# Patient Record
Sex: Female | Born: 1960 | Race: White | Hispanic: No | Marital: Married | Smoking: Current every day smoker
Health system: Southern US, Community
[De-identification: ages and names within clinical notes are randomized; demographics above are authoritative.]

## PROBLEM LIST (undated history)

## (undated) DIAGNOSIS — D649 Anemia, unspecified: Secondary | ICD-10-CM

## (undated) DIAGNOSIS — E039 Hypothyroidism, unspecified: Secondary | ICD-10-CM

## (undated) DIAGNOSIS — F329 Major depressive disorder, single episode, unspecified: Secondary | ICD-10-CM

## (undated) DIAGNOSIS — G894 Chronic pain syndrome: Secondary | ICD-10-CM

## (undated) DIAGNOSIS — F319 Bipolar disorder, unspecified: Secondary | ICD-10-CM

## (undated) DIAGNOSIS — IMO0002 Reserved for concepts with insufficient information to code with codable children: Secondary | ICD-10-CM

## (undated) DIAGNOSIS — M797 Fibromyalgia: Secondary | ICD-10-CM

## (undated) DIAGNOSIS — M1990 Unspecified osteoarthritis, unspecified site: Secondary | ICD-10-CM

## (undated) DIAGNOSIS — K219 Gastro-esophageal reflux disease without esophagitis: Secondary | ICD-10-CM

## (undated) DIAGNOSIS — J45909 Unspecified asthma, uncomplicated: Secondary | ICD-10-CM

## (undated) DIAGNOSIS — Z5189 Encounter for other specified aftercare: Secondary | ICD-10-CM

## (undated) DIAGNOSIS — F32A Depression, unspecified: Secondary | ICD-10-CM

## (undated) DIAGNOSIS — G4730 Sleep apnea, unspecified: Secondary | ICD-10-CM

## (undated) DIAGNOSIS — F419 Anxiety disorder, unspecified: Secondary | ICD-10-CM

## (undated) DIAGNOSIS — K759 Inflammatory liver disease, unspecified: Secondary | ICD-10-CM

## (undated) HISTORY — PX: TONSILLECTOMY: SUR1361

## (undated) HISTORY — PX: JOINT REPLACEMENT: SHX530

## (undated) HISTORY — PX: ABDOMINAL HYSTERECTOMY: SHX81

## (undated) HISTORY — PX: HIP SURGERY: SHX245

## (undated) HISTORY — PX: GASTRIC BYPASS: SHX52

## (undated) HISTORY — PX: BACK SURGERY: SHX140

## (undated) HISTORY — PX: DIAGNOSTIC LAPAROSCOPY: SUR761

## (undated) HISTORY — PX: RECTOCELE REPAIR: SHX761

## (undated) HISTORY — PX: RHINOPLASTY: SUR1284

## (undated) HISTORY — PX: KNEE ARTHROSCOPY: SUR90

## (undated) HISTORY — PX: DILATION AND CURETTAGE OF UTERUS: SHX78

## (undated) HISTORY — PX: BREAST SURGERY: SHX581

---

## 2013-10-27 HISTORY — PX: CARDIAC CATHETERIZATION: SHX172

## 2013-12-12 ENCOUNTER — Inpatient Hospital Stay (HOSPITAL_COMMUNITY): Admission: RE | Admit: 2013-12-12 | Payer: Self-pay | Source: Ambulatory Visit

## 2013-12-19 ENCOUNTER — Encounter (HOSPITAL_COMMUNITY): Admission: RE | Payer: Self-pay | Source: Ambulatory Visit

## 2013-12-19 ENCOUNTER — Inpatient Hospital Stay (HOSPITAL_COMMUNITY): Admission: RE | Admit: 2013-12-19 | Payer: Medicare Other | Source: Ambulatory Visit | Admitting: Orthopedic Surgery

## 2013-12-19 SURGERY — TOTAL HIP REVISION
Anesthesia: Spinal | Site: Hip | Laterality: Left

## 2014-04-03 ENCOUNTER — Encounter (HOSPITAL_COMMUNITY): Payer: Self-pay | Admitting: Pharmacy Technician

## 2014-04-04 ENCOUNTER — Other Ambulatory Visit (HOSPITAL_COMMUNITY): Payer: Self-pay | Admitting: Orthopedic Surgery

## 2014-04-04 NOTE — Patient Instructions (Addendum)
21 Tina Weeks  04/04/2014   Your procedure is scheduled on: Monday June 15th, 2015  Report to [facility] Main Entrance and follow signs to  Short Stay Center at 730 AM.  Call this number if you have problems the morning of surgery [phone]   Remember:BRING CPAP MASK AND TUBING  Do not eat food or drink liquids :After Midnight.  Sunday NIGHT     Take these medicines the morning of surgery with A SIP OF WATER:  LEVOTHYROXINE, Soboxone, NEXIUM, CLONAZEPAM, NUCYNTA, LYRICA,  GABAPENTIN, HYDROXINE                               You may not have any metal on your body including hair pins and piercings  Do not wear jewelry, make-up, lotions, powders, or deodorant.   Men may shave face and neck.  Do not bring valuables to the hospital. [facility] IS NOT RESPONSIBLE FOR VALUABLES.  Contacts, dentures or bridgework may not be worn into surgery.  Leave suitcase in the car. After surgery it may be brought to your room.  For patients admitted to the hospital, checkout time is 11:00 AM the day of discharge.   Patients discharged the day of surgery will not be allowed to drive home.  Name and phone number of your driver:husband  Special Instructions: N/A ________________________________________________________________________  [facility] - Preparing for Surgery Before surgery, you can play an important role.  Because skin is not sterile, your skin needs to be as free of germs as possible.  You can reduce the number of germs on your skin by washing with CHG (chlorahexidine gluconate) soap before surgery.  CHG is an antiseptic cleaner which kills germs and bonds with the skin to continue killing germs even after washing. Please DO NOT use if you have an allergy to CHG or antibacterial soaps.  If your skin becomes reddened/irritated stop using the CHG and inform your nurse when you arrive at Short Stay. Do not shave (including legs and underarms) for at least 48 hours prior to the first  CHG shower.  You may shave your face/neck. Please follow these instructions carefully:  1.  Shower with CHG Soap the night before surgery and the  morning of Surgery.  2.  If you choose to wash your hair, wash your hair first as usual with your  normal  shampoo.  3.  After you shampoo, rinse your hair and body thoroughly to remove the  shampoo.                           4.  Use CHG as you would any other liquid soap.  You can apply chg directly  to the skin and wash                       Gently with a scrungie or clean washcloth.  5.  Apply the CHG Soap to your body ONLY FROM THE NECK DOWN.   Do not use on face/ open                           Wound or open sores. Avoid contact with eyes, ears mouth and genitals (private parts).                       Wash face,  Genitals (private parts) with your normal soap.             6.  Wash thoroughly, paying special attention to the area where your surgery  will be performed.  7.  Thoroughly rinse your body with warm water from the neck down.  8.  DO NOT shower/wash with your normal soap after using and rinsing off  the CHG Soap.                9.  Pat yourself dry with a clean towel.            10.  Wear clean pajamas.            11.  Place clean sheets on your bed the night of your first shower and do not  sleep with pets. Day of Surgery : Do not apply any lotions/deodorants the morning of surgery.  Please wear clean clothes to the hospital/surgery center.  FAILURE TO FOLLOW THESE INSTRUCTIONS MAY RESULT IN THE CANCELLATION OF YOUR SURGERY PATIENT SIGNATURE_________________________________  NURSE SIGNATURE__________________________________  ________________________________________________________________________   Rogelia MireIncentive Spirometer  An incentive spirometer is a tool that can help keep your lungs clear and active. This tool measures how well you are filling your lungs with each breath. Taking long deep breaths may help reverse or decrease the  chance of developing breathing (pulmonary) problems (especially infection) following:  A long period of time when you are unable to move or be active. BEFORE THE PROCEDURE   If the spirometer includes an indicator to show your best effort, your nurse or respiratory therapist will set it to a desired goal.  If possible, sit up straight or lean slightly forward. Try not to slouch.  Hold the incentive spirometer in an upright position. INSTRUCTIONS FOR USE  1. Sit on the edge of your bed if possible, or sit up as far as you can in bed or on a chair. 2. Hold the incentive spirometer in an upright position. 3. Breathe out normally. 4. Place the mouthpiece in your mouth and seal your lips tightly around it. 5. Breathe in slowly and as deeply as possible, raising the piston or the ball toward the top of the column. 6. Hold your breath for 3-5 seconds or for as long as possible. Allow the piston or ball to fall to the bottom of the column. 7. Remove the mouthpiece from your mouth and breathe out normally. 8. Rest for a few seconds and repeat Steps 1 through 7 at least 10 times every 1-2 hours when you are awake. Take your time and take a few normal breaths between deep breaths. 9. The spirometer may include an indicator to show your best effort. Use the indicator as a goal to work toward during each repetition. 10. After each set of 10 deep breaths, practice coughing to be sure your lungs are clear. If you have an incision (the cut made at the time of surgery), support your incision when coughing by placing a pillow or rolled up towels firmly against it. Once you are able to get out of bed, walk around indoors and cough well. You may stop using the incentive spirometer when instructed by your caregiver.  RISKS AND COMPLICATIONS  Take your time so you do not get dizzy or light-headed.  If you are in pain, you may need to take or ask for pain medication before doing incentive spirometry. It is harder  to take a deep breath if you are having pain. AFTER USE  Rest and breathe slowly and easily.  It can be helpful to keep track of a log of your progress. Your caregiver can provide you with a simple table to help with this. If you are using the spirometer at home, follow these instructions: SEEK MEDICAL CARE IF:   You are having difficultly using the spirometer.  You have trouble using the spirometer as often as instructed.  Your pain medication is not giving enough relief while using the spirometer.  You develop fever of 100.5 F (38.1 C) or higher. SEEK IMMEDIATE MEDICAL CARE IF:   You cough up bloody sputum that had not been present before.  You develop fever of 102 F (38.9 C) or greater.  You develop worsening pain at or near the incision site. MAKE SURE YOU:   Understand these instructions.  Will watch your condition.  Will get help right away if you are not doing well or get worse. Document Released: 02/23/2007 Document Revised: 01/05/2012 Document Reviewed: 04/26/2007 Exit[employer] Patient Information 2014 Exit[employer], [place].   ________________________________________________________________________  WHAT IS A BLOOD TRANSFUSION? Blood Transfusion Information  A transfusion is the replacement of blood or some of its parts. Blood is made up of multiple cells which provide different functions.  Red blood cells carry oxygen and are used for blood loss replacement.  White blood cells fight against infection.  Platelets control bleeding.  Plasma helps clot blood.  Other blood products are available for specialized needs, such as hemophilia or other clotting disorders. BEFORE THE TRANSFUSION  Who gives blood for transfusions?   Healthy volunteers who are fully evaluated to make sure their blood is safe. This is blood bank blood. Transfusion therapy is the safest it has ever been in the practice of medicine. Before blood is taken from a donor, a complete history is taken  to make sure that person has no history of diseases nor engages in risky social behavior (examples are intravenous drug use or sexual activity with multiple partners). The donor's travel history is screened to minimize risk of transmitting infections, such as malaria. The donated blood is tested for signs of infectious diseases, such as HIV and hepatitis. The blood is then tested to be sure it is compatible with you in order to minimize the chance of a transfusion reaction. If you or a relative donates blood, this is often done in anticipation of surgery and is not appropriate for emergency situations. It takes many days to process the donated blood. RISKS AND COMPLICATIONS Although transfusion therapy is very safe and saves many lives, the main dangers of transfusion include:   Getting an infectious disease.  Developing a transfusion reaction. This is an allergic reaction to something in the blood you were given. Every precaution is taken to prevent this. The decision to have a blood transfusion has been considered carefully by your caregiver before blood is given. Blood is not given unless the benefits outweigh the risks. AFTER THE TRANSFUSION  Right after receiving a blood transfusion, you will usually feel much better and more energetic. This is especially true if your red blood cells have gotten low (anemic). The transfusion raises the level of the red blood cells which carry oxygen, and this usually causes an energy increase.  The nurse administering the transfusion will monitor you carefully for complications. HOME CARE INSTRUCTIONS  No special instructions are needed after a transfusion. You may find your energy is better. Speak with your caregiver about any limitations on activity for underlying diseases you may have. SEEK  MEDICAL CARE IF:   Your condition is not improving after your transfusion.  You develop redness or irritation at the intravenous (IV) site. SEEK IMMEDIATE MEDICAL CARE  IF:  Any of the following symptoms occur over the next 12 hours:  Shaking chills.  You have a temperature by mouth above 102 F (38.9 C), not controlled by medicine.  Chest, back, or muscle pain.  People around you feel you are not acting correctly or are confused.  Shortness of breath or difficulty breathing.  Dizziness and fainting.  You get a rash or develop hives.  You have a decrease in urine output.  Your urine turns a dark color or changes to pink, red, or brown. Any of the following symptoms occur over the next 10 days:  You have a temperature by mouth above 102 F (38.9 C), not controlled by medicine.  Shortness of breath.  Weakness after normal activity.  The white part of the eye turns yellow (jaundice).  You have a decrease in the amount of urine or are urinating less often.  Your urine turns a dark color or changes to pink, red, or brown. Document Released: 10/10/2000 Document Revised: 01/05/2012 Document Reviewed: 05/29/2008 [facility] Patient Information 2014 [place], [place].  _______________________________________________________________________

## 2014-04-04 NOTE — Progress Notes (Signed)
MEDICAL CLEARANCE NOTE Tina Weeks FNO ON CHART CARDIAC CLEARANCE/LOV NOTE DR Eliezer Bottom  02-11-14 ON CHART PULMONARY CLEARANCE/LOV NOTE DTR [facility] 03-10-14 ON CHART SLEEP STUDY 01-09-14 [facility] ON CHART EKG 01-17-14 [facility] ON CHART CHEST XRAY 01-18-14 [facility] ON CHART

## 2014-04-05 ENCOUNTER — Encounter (INDEPENDENT_AMBULATORY_CARE_PROVIDER_SITE_OTHER): Payer: Self-pay

## 2014-04-05 ENCOUNTER — Encounter (HOSPITAL_COMMUNITY): Payer: Self-pay

## 2014-04-05 ENCOUNTER — Encounter (HOSPITAL_COMMUNITY)
Admission: RE | Admit: 2014-04-05 | Discharge: 2014-04-05 | Disposition: A | Discharge status: Home / Self Care | Payer: Medicare Other | Source: Ambulatory Visit | Attending: Orthopedic Surgery | Admitting: Orthopedic Surgery

## 2014-04-05 DIAGNOSIS — Z01818 Encounter for other preprocedural examination: Secondary | ICD-10-CM | POA: Insufficient documentation

## 2014-04-05 DIAGNOSIS — Z01812 Encounter for preprocedural laboratory examination: Secondary | ICD-10-CM | POA: Insufficient documentation

## 2014-04-05 HISTORY — DX: Depression, unspecified: F32.A

## 2014-04-05 HISTORY — DX: Reserved for concepts with insufficient information to code with codable children: IMO0002

## 2014-04-05 HISTORY — DX: Major depressive disorder, single episode, unspecified: F32.9

## 2014-04-05 HISTORY — DX: Fibromyalgia: M79.7

## 2014-04-05 HISTORY — DX: Unspecified asthma, uncomplicated: J45.909

## 2014-04-05 HISTORY — DX: Bipolar disorder, unspecified: F31.9

## 2014-04-05 HISTORY — DX: Encounter for other specified aftercare: Z51.89

## 2014-04-05 HISTORY — DX: Anxiety disorder, unspecified: F41.9

## 2014-04-05 HISTORY — DX: Inflammatory liver disease, unspecified: K75.9

## 2014-04-05 HISTORY — DX: Anemia, unspecified: D64.9

## 2014-04-05 HISTORY — DX: Gastro-esophageal reflux disease without esophagitis: K21.9

## 2014-04-05 HISTORY — DX: Unspecified osteoarthritis, unspecified site: M19.90

## 2014-04-05 HISTORY — DX: Hypothyroidism, unspecified: E03.9

## 2014-04-05 HISTORY — DX: Sleep apnea, unspecified: G47.30

## 2014-04-05 HISTORY — DX: Chronic pain syndrome: G89.4

## 2014-04-05 LAB — URINALYSIS, ROUTINE W REFLEX MICROSCOPIC
Bilirubin Urine: NEGATIVE
Glucose, UA: NEGATIVE mg/dL
Hgb urine dipstick: NEGATIVE
Ketones, ur: NEGATIVE mg/dL
LEUKOCYTES UA: NEGATIVE
Nitrite: NEGATIVE
PH: 6 (ref 5.0–8.0)
Protein, ur: NEGATIVE mg/dL
Specific Gravity, Urine: 1.008 (ref 1.005–1.030)
Urobilinogen, UA: 0.2 mg/dL (ref 0.0–1.0)

## 2014-04-05 LAB — COMPREHENSIVE METABOLIC PANEL
ALK PHOS: 75 U/L (ref 39–117)
ALT: 30 U/L (ref 0–35)
AST: 34 U/L (ref 0–37)
Albumin: 3.6 g/dL (ref 3.5–5.2)
BUN: 9 mg/dL (ref 6–23)
CO2: 29 mEq/L (ref 19–32)
Calcium: 9.2 mg/dL (ref 8.4–10.5)
Chloride: 101 mEq/L (ref 96–112)
Creatinine, Ser: 0.69 mg/dL (ref 0.50–1.10)
GFR calc Af Amer: >90 mL/min (ref >90)
GFR calc non Af Amer: >90 mL/min (ref >90)
Glucose, Bld: 91 mg/dL (ref 70–99)
Potassium: 4.5 mEq/L (ref 3.7–5.3)
SODIUM: 141 meq/L (ref 137–147)
TOTAL PROTEIN: 7.6 g/dL (ref 6.0–8.3)
Total Bilirubin: 0.3 mg/dL (ref 0.3–1.2)

## 2014-04-05 LAB — ABO/RH: ABO/RH(D): O POS

## 2014-04-05 LAB — CBC
HCT: 40.8 % (ref 36.0–46.0)
Hemoglobin: 13.3 g/dL (ref 12.0–15.0)
MCH: 27 pg (ref 26.0–34.0)
MCHC: 32.6 g/dL (ref 30.0–36.0)
MCV: 82.8 fL (ref 78.0–100.0)
Platelets: 176 10*3/uL (ref 150–400)
RBC: 4.93 MIL/uL (ref 3.87–5.11)
RDW: 17.2 % — AB (ref 11.5–15.5)
WBC: 8.3 10*3/uL (ref 4.0–10.5)

## 2014-04-05 LAB — PROTIME-INR
INR: 1.07 (ref 0.00–1.49)
Prothrombin Time: 13.7 seconds (ref 11.6–15.2)

## 2014-04-05 LAB — APTT: APTT: 31 s (ref 24–37)

## 2014-04-05 LAB — SURGICAL PCR SCREEN
MRSA, PCR: NEGATIVE
Staphylococcus aureus: NEGATIVE

## 2014-04-05 NOTE — Progress Notes (Signed)
Spoke with Dr Renold Don regarding pulmonary recommendations pre op from Dr Blenda Nicely.  Stated will be evaluated morning of surgery depending on type of anesthesia will be utilized.   Record placed on chart.  Instructed patient of  time change.  She is to arrive SHORT STAY at 12:15 pm.  May have clear liquids until 08:15 am then nothing by mouth.  Verbalizes understanding, and clear liquids reviewed

## 2014-04-09 NOTE — H&P (Signed)
TOTAL HIP REVISION ADMISSION H&P  Patient is admitted for left revision total hip arthroplasty.  Subjective:  Chief Complaint: Failed left hip arthroplasty  HPI: Clemencia Courselaudia Riebel, [age] y.o. 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  The indications for the revision total hip arthroplasty are pain and malrotation.  Onset of symptoms was gradual starting years ago with gradually worsening course since that time.  Prior procedures on the left hip include arthroplasty and a revision.  Patient currently rates pain in the left hip at 9 out of 10 with activity.  There is night pain, worsening of pain with activity and weight bearing, trendelenberg gait, pain that interfers with activities of daily living and pain with passive range of motion. 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  This condition presents safety issues increasing the risk of falls.  There is no current active infection.  Risks, benefits and expectations were discussed with the patient.  Risks including but not limited to the risk of anesthesia, blood clots, nerve damage, blood vessel damage, failure of the prosthesis, infection and up to and including death.  Patient understand the risks, benefits and expectations and wishes to proceed with surgery.   PCP: Dema SeverinYORK,REGINA F, NP  D/C Plans:      Home with HHPT  Post-op Meds:       No Rx given   Tranexamic Acid:      Is not to be given - previous MI  Decadron:      Is to be given  FYI:     ASA post-op  CPAP  Pain management - Dr. Loraine LericheMark (inc current)  Nicotine Patch - 14 mg    Past Medical History  Diagnosis Date  . Hypothyroidism   . Sleep apnea   . GERD (gastroesophageal reflux disease)   . Fibromyalgia   . Arthritis   . Encounter for blood transfusion   . Hepatitis     C-   years ago  . Anemia   . DDD (degenerative disc disease)     lumbar, cervical   . Bipolar disorder   . Chronic pain syndrome   . Anxiety   . Depression   . Asthma     intrensic    Past Surgical History  Procedure Laterality Date  . Joint replacement Bilateral   . Hip surgery Left     revision/replacements x 14  . Back surgery      cervical fusion x 3  . Back surgery      fusions x 5  lumbar/sacral  . Tonsillectomy    . Breast surgery Right     I&D x 2  . Abdominal hysterectomy    . Dilation and curettage of uterus      x 3  . Diagnostic laparoscopy      salpingectomy  . Rectocele repair    . Knee arthroscopy Right     x 2  . Rhinoplasty    . Cesarean section    . Cardiac catheterization  2015  . Gastric bypass      No prescriptions prior to admission   Allergies  Allergen Reactions  . Shellfish Allergy Hives    History  Substance Use Topics  . Smoking status: Current Every Day Smoker -- 0.50 packs/day for 25 years  . Smokeless tobacco: Never Used  . Alcohol Use: No    No family history on  file.    Review of Systems  Constitutional: Negative.   HENT: Negative.   Eyes: Negative.   Respiratory: Negative.   Cardiovascular: Negative.   Gastrointestinal: Positive for heartburn.  Genitourinary: Negative.   Musculoskeletal: Positive for back pain, joint pain and myalgias.  Skin: Negative.   Neurological: Negative.   Endo/Heme/Allergies: Negative.   Psychiatric/Behavioral: Positive for depression. The patient is nervous/anxious.     Objective:  Physical Exam  Constitutional: She is oriented to person, place, and time. She appears well-developed and well-nourished.  HENT:  Head: Normocephalic and atraumatic.  Mouth/Throat: Oropharynx is clear and moist.  Eyes: Pupils are equal, round, and reactive to light.  Neck: Neck supple. No JVD present. No tracheal deviation present. No thyromegaly present.  Cardiovascular: Normal rate, regular rhythm, normal heart sounds and intact distal pulses.   Respiratory: Effort normal and breath  sounds normal. No stridor. No respiratory distress. She has no wheezes.  GI: Soft. There is no tenderness. There is no guarding.  Musculoskeletal:       Left hip: She exhibits decreased range of motion, decreased strength, tenderness, bony tenderness and laceration (healed). She exhibits no swelling and no deformity.  Lymphadenopathy:    She has no cervical adenopathy.  Neurological: She is alert and oriented to person, place, and time.  Skin: Skin is warm and dry.  Psychiatric: She has a normal mood and affect.     Imaging Review:  Plain radiographs demonstrate previous total hip arthroplasty of the left hip(s). 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   Assessment/Plan:  End stage arthritis, left hip(s) with failed previous arthroplasty.  The patient history, physical examination, clinical judgement of the provider and imaging studies are consistent with end stage degenerative joint disease of the left hip(s), previous total hip arthroplasty. Revision total hip arthroplasty is deemed medically necessary. The treatment options including medical management, injection therapy, arthroscopy and arthroplasty were discussed at length. The risks and benefits of total hip arthroplasty were presented and reviewed. The risks due to aseptic loosening, infection, stiffness, dislocation/subluxation,  thromboembolic complications and other imponderables were discussed.  The patient acknowledged the explanation, agreed to proceed with the plan and consent was signed. Patient is being admitted for inpatient treatment for surgery, pain control, PT, OT, prophylactic antibiotics, VTE prophylaxis, progressive ambulation and ADL's and discharge planning. The patient is planning to be discharged home with home health services.    Anastasio AuerbachMatthew S. Sahithi Ordoyne   PA-C  04/09/2014, 5:38 PM

## 2014-04-10 ENCOUNTER — Encounter (HOSPITAL_COMMUNITY)
Admission: RE | Disposition: A | Discharge status: Home Health | Payer: Self-pay | Source: Ambulatory Visit | Attending: Orthopedic Surgery

## 2014-04-10 ENCOUNTER — Inpatient Hospital Stay (HOSPITAL_COMMUNITY)
Admission: RE | Admit: 2014-04-10 | Discharge: 2014-04-13 | DRG: 467 | Disposition: A | Discharge status: Home Health | Payer: Medicare Other | Source: Ambulatory Visit | Attending: Orthopedic Surgery | Admitting: Orthopedic Surgery

## 2014-04-10 ENCOUNTER — Inpatient Hospital Stay (HOSPITAL_COMMUNITY): Payer: Medicare Other | Admitting: *Deleted

## 2014-04-10 ENCOUNTER — Inpatient Hospital Stay (HOSPITAL_COMMUNITY): Payer: Medicare Other

## 2014-04-10 ENCOUNTER — Encounter (HOSPITAL_COMMUNITY): Payer: Self-pay | Admitting: *Deleted

## 2014-04-10 ENCOUNTER — Encounter (HOSPITAL_COMMUNITY): Payer: Medicare Other | Admitting: *Deleted

## 2014-04-10 DIAGNOSIS — K219 Gastro-esophageal reflux disease without esophagitis: Secondary | ICD-10-CM | POA: Diagnosis present

## 2014-04-10 DIAGNOSIS — Z96649 Presence of unspecified artificial hip joint: Secondary | ICD-10-CM

## 2014-04-10 DIAGNOSIS — G4730 Sleep apnea, unspecified: Secondary | ICD-10-CM | POA: Diagnosis present

## 2014-04-10 DIAGNOSIS — D62 Acute posthemorrhagic anemia: Secondary | ICD-10-CM | POA: Diagnosis not present

## 2014-04-10 DIAGNOSIS — F329 Major depressive disorder, single episode, unspecified: Secondary | ICD-10-CM | POA: Diagnosis present

## 2014-04-10 DIAGNOSIS — J45909 Unspecified asthma, uncomplicated: Secondary | ICD-10-CM | POA: Diagnosis present

## 2014-04-10 DIAGNOSIS — F411 Generalized anxiety disorder: Secondary | ICD-10-CM | POA: Diagnosis present

## 2014-04-10 DIAGNOSIS — Z8619 Personal history of other infectious and parasitic diseases: Secondary | ICD-10-CM

## 2014-04-10 DIAGNOSIS — E669 Obesity, unspecified: Secondary | ICD-10-CM | POA: Diagnosis present

## 2014-04-10 DIAGNOSIS — IMO0001 Reserved for inherently not codable concepts without codable children: Secondary | ICD-10-CM | POA: Diagnosis present

## 2014-04-10 DIAGNOSIS — D500 Iron deficiency anemia secondary to blood loss (chronic): Secondary | ICD-10-CM | POA: Diagnosis not present

## 2014-04-10 DIAGNOSIS — T84099A Other mechanical complication of unspecified internal joint prosthesis, initial encounter: Principal | ICD-10-CM | POA: Diagnosis present

## 2014-04-10 DIAGNOSIS — Y831 Surgical operation with implant of artificial internal device as the cause of abnormal reaction of the patient, or of later complication, without mention of misadventure at the time of the procedure: Secondary | ICD-10-CM | POA: Diagnosis present

## 2014-04-10 DIAGNOSIS — E039 Hypothyroidism, unspecified: Secondary | ICD-10-CM | POA: Diagnosis present

## 2014-04-10 DIAGNOSIS — Z01812 Encounter for preprocedural laboratory examination: Secondary | ICD-10-CM

## 2014-04-10 DIAGNOSIS — F17200 Nicotine dependence, unspecified, uncomplicated: Secondary | ICD-10-CM | POA: Diagnosis present

## 2014-04-10 DIAGNOSIS — Z6837 Body mass index (BMI) 37.0-37.9, adult: Secondary | ICD-10-CM

## 2014-04-10 DIAGNOSIS — F3289 Other specified depressive episodes: Secondary | ICD-10-CM | POA: Diagnosis present

## 2014-04-10 HISTORY — PX: TOTAL HIP REVISION: SHX763

## 2014-04-10 LAB — TYPE AND SCREEN
ABO/RH(D): O POS
Antibody Screen: NEGATIVE

## 2014-04-10 SURGERY — TOTAL HIP REVISION
Anesthesia: General | Site: Hip | Laterality: Left

## 2014-04-10 MED ORDER — SUFENTANIL CITRATE 50 MCG/ML IV SOLN
INTRAVENOUS | Status: AC
  Filled 2014-04-10: qty 1

## 2014-04-10 MED ORDER — CELECOXIB 200 MG PO CAPS
200.0000 mg | ORAL_CAPSULE | Freq: Two times a day (BID) | ORAL | Status: DC
  Administered 2014-04-10 – 2014-04-13 (×5): 200 mg via ORAL
  Filled 2014-04-10 (×7): qty 1

## 2014-04-10 MED ORDER — CISATRACURIUM BESYLATE (PF) 10 MG/5ML IV SOLN
INTRAVENOUS | Status: DC | PRN
  Administered 2014-04-10: 8 mg via INTRAVENOUS

## 2014-04-10 MED ORDER — MEPERIDINE HCL 50 MG/ML IJ SOLN: 6.2500 mg | INTRAMUSCULAR | Status: DC | PRN

## 2014-04-10 MED ORDER — SUFENTANIL CITRATE 50 MCG/ML IV SOLN
INTRAVENOUS | Status: DC | PRN
  Administered 2014-04-10: 10 ug via INTRAVENOUS
  Administered 2014-04-10: 20 ug via INTRAVENOUS

## 2014-04-10 MED ORDER — ONDANSETRON HCL 4 MG PO TABS: 4.0000 mg | ORAL_TABLET | Freq: Four times a day (QID) | ORAL | Status: DC | PRN

## 2014-04-10 MED ORDER — HYDROMORPHONE HCL PF 1 MG/ML IJ SOLN
INTRAMUSCULAR | Status: DC | PRN
Start: 2014-04-10 — End: 2014-04-10
  Administered 2014-04-10 (×4): 0.5 mg via INTRAVENOUS

## 2014-04-10 MED ORDER — PREGABALIN 100 MG PO CAPS
300.0000 mg | ORAL_CAPSULE | Freq: Two times a day (BID) | ORAL | Status: DC
  Administered 2014-04-10 – 2014-04-13 (×3): 300 mg via ORAL
  Filled 2014-04-10 (×2): qty 3
  Filled 2014-04-10: qty 6

## 2014-04-10 MED ORDER — GLYCOPYRROLATE 0.2 MG/ML IJ SOLN
INTRAMUSCULAR | Status: AC
  Filled 2014-04-10: qty 3

## 2014-04-10 MED ORDER — CLONAZEPAM 1 MG PO TABS
1.0000 mg | ORAL_TABLET | Freq: Four times a day (QID) | ORAL | Status: DC
  Administered 2014-04-10: 1 mg via ORAL
  Filled 2014-04-10 (×2): qty 1

## 2014-04-10 MED ORDER — PROPOFOL 10 MG/ML IV BOLUS
INTRAVENOUS | Status: AC
  Filled 2014-04-10: qty 20

## 2014-04-10 MED ORDER — GLYCOPYRROLATE 0.2 MG/ML IJ SOLN
INTRAMUSCULAR | Status: DC | PRN
  Administered 2014-04-10: 0.6 mg via INTRAVENOUS

## 2014-04-10 MED ORDER — ONDANSETRON HCL 4 MG/2ML IJ SOLN
INTRAMUSCULAR | Status: AC
  Filled 2014-04-10: qty 2

## 2014-04-10 MED ORDER — CISATRACURIUM BESYLATE 20 MG/10ML IV SOLN
INTRAVENOUS | Status: AC
  Filled 2014-04-10: qty 10

## 2014-04-10 MED ORDER — PROMETHAZINE HCL 25 MG/ML IJ SOLN
6.2500 mg | INTRAMUSCULAR | Status: DC | PRN
Start: 2014-04-10 — End: 2014-04-10

## 2014-04-10 MED ORDER — DOCUSATE SODIUM 100 MG PO CAPS
100.0000 mg | ORAL_CAPSULE | Freq: Two times a day (BID) | ORAL | Status: DC
  Administered 2014-04-10 – 2014-04-13 (×5): 100 mg via ORAL

## 2014-04-10 MED ORDER — BISACODYL 10 MG RE SUPP: 10.0000 mg | Freq: Every day | RECTAL | Status: DC | PRN

## 2014-04-10 MED ORDER — METOCLOPRAMIDE HCL 10 MG PO TABS: 5.0000 mg | ORAL_TABLET | Freq: Three times a day (TID) | ORAL | Status: DC | PRN

## 2014-04-10 MED ORDER — PHENOL 1.4 % MT LIQD
1.0000 | OROMUCOSAL | Status: DC | PRN
  Filled 2014-04-10: qty 177

## 2014-04-10 MED ORDER — DEXAMETHASONE SODIUM PHOSPHATE 10 MG/ML IJ SOLN: 10.0000 mg | Freq: Once | INTRAMUSCULAR | Status: DC

## 2014-04-10 MED ORDER — SODIUM CHLORIDE 0.9 % IV SOLN
100.0000 mL/h | INTRAVENOUS | Status: DC
  Administered 2014-04-10 – 2014-04-11 (×3): 100 mL/h via INTRAVENOUS
  Filled 2014-04-10 (×11): qty 1000

## 2014-04-10 MED ORDER — WARFARIN SODIUM 7.5 MG PO TABS
7.5000 mg | ORAL_TABLET | Freq: Once | ORAL | Status: AC
  Administered 2014-04-10: 7.5 mg via ORAL
  Filled 2014-04-10: qty 1

## 2014-04-10 MED ORDER — METOCLOPRAMIDE HCL 5 MG/ML IJ SOLN: 5.0000 mg | Freq: Three times a day (TID) | INTRAMUSCULAR | Status: DC | PRN

## 2014-04-10 MED ORDER — LACTATED RINGERS IV SOLN
INTRAVENOUS | Status: DC
  Administered 2014-04-10: 1000 mL via INTRAVENOUS

## 2014-04-10 MED ORDER — SUCCINYLCHOLINE CHLORIDE 20 MG/ML IJ SOLN
INTRAMUSCULAR | Status: DC | PRN
  Administered 2014-04-10: 100 mg via INTRAVENOUS

## 2014-04-10 MED ORDER — NEOSTIGMINE METHYLSULFATE 10 MG/10ML IV SOLN
INTRAVENOUS | Status: DC | PRN
  Administered 2014-04-10: 4 mg via INTRAVENOUS

## 2014-04-10 MED ORDER — METHOCARBAMOL 500 MG PO TABS
500.0000 mg | ORAL_TABLET | Freq: Four times a day (QID) | ORAL | Status: DC | PRN
  Administered 2014-04-12 – 2014-04-13 (×2): 500 mg via ORAL
  Filled 2014-04-10 (×2): qty 1

## 2014-04-10 MED ORDER — ONDANSETRON HCL 4 MG/2ML IJ SOLN
4.0000 mg | Freq: Four times a day (QID) | INTRAMUSCULAR | Status: DC | PRN
Start: 2014-04-10 — End: 2014-04-13

## 2014-04-10 MED ORDER — ALUM & MAG HYDROXIDE-SIMETH 200-200-20 MG/5ML PO SUSP: 30.0000 mL | ORAL | Status: DC | PRN

## 2014-04-10 MED ORDER — LEVOTHYROXINE SODIUM 137 MCG PO TABS
137.0000 ug | ORAL_TABLET | Freq: Every day | ORAL | Status: DC
  Administered 2014-04-11 – 2014-04-13 (×3): 137 ug via ORAL
  Filled 2014-04-10 (×4): qty 1

## 2014-04-10 MED ORDER — EPHEDRINE SULFATE 50 MG/ML IJ SOLN
INTRAMUSCULAR | Status: DC | PRN
  Administered 2014-04-10 (×2): 10 mg via INTRAVENOUS

## 2014-04-10 MED ORDER — PROPOFOL 10 MG/ML IV BOLUS
INTRAVENOUS | Status: DC | PRN
  Administered 2014-04-10: 200 mg via INTRAVENOUS

## 2014-04-10 MED ORDER — RISPERIDONE 2 MG PO TABS
4.0000 mg | ORAL_TABLET | Freq: Every day | ORAL | Status: DC
  Administered 2014-04-10: 4 mg via ORAL
  Filled 2014-04-10 (×4): qty 2

## 2014-04-10 MED ORDER — BUPRENORPHINE HCL 8 MG SL SUBL
8.0000 mg | SUBLINGUAL_TABLET | Freq: Three times a day (TID) | SUBLINGUAL | Status: DC
  Administered 2014-04-10 – 2014-04-13 (×4): 8 mg via SUBLINGUAL
  Filled 2014-04-10 (×4): qty 1

## 2014-04-10 MED ORDER — MAGNESIUM CITRATE PO SOLN: 1.0000 | Freq: Once | ORAL | Status: AC | PRN

## 2014-04-10 MED ORDER — MIDAZOLAM HCL 2 MG/2ML IJ SOLN
INTRAMUSCULAR | Status: AC
  Filled 2014-04-10: qty 2

## 2014-04-10 MED ORDER — HYDROXYZINE HCL 50 MG PO TABS
50.0000 mg | ORAL_TABLET | Freq: Four times a day (QID) | ORAL | Status: DC
  Administered 2014-04-10 – 2014-04-13 (×2): 50 mg via ORAL
  Filled 2014-04-10 (×13): qty 1

## 2014-04-10 MED ORDER — SODIUM CHLORIDE 0.9 % IR SOLN
Status: DC | PRN
  Administered 2014-04-10: 3000 mL

## 2014-04-10 MED ORDER — HYDROMORPHONE HCL PF 1 MG/ML IJ SOLN: 0.5000 mg | INTRAMUSCULAR | Status: DC | PRN

## 2014-04-10 MED ORDER — POLYETHYLENE GLYCOL 3350 17 G PO PACK
17.0000 g | PACK | Freq: Two times a day (BID) | ORAL | Status: DC
  Administered 2014-04-10 – 2014-04-12 (×3): 17 g via ORAL

## 2014-04-10 MED ORDER — HYDROMORPHONE HCL PF 2 MG/ML IJ SOLN
INTRAMUSCULAR | Status: AC
  Filled 2014-04-10: qty 1

## 2014-04-10 MED ORDER — LIDOCAINE HCL (CARDIAC) 20 MG/ML IV SOLN
INTRAVENOUS | Status: DC | PRN
  Administered 2014-04-10: 100 mg via INTRAVENOUS

## 2014-04-10 MED ORDER — OXYCODONE HCL 5 MG PO TABS: 5.0000 mg | ORAL_TABLET | Freq: Once | ORAL | Status: DC | PRN

## 2014-04-10 MED ORDER — PANTOPRAZOLE SODIUM 40 MG PO TBEC
80.0000 mg | DELAYED_RELEASE_TABLET | Freq: Every day | ORAL | Status: DC
  Administered 2014-04-11 – 2014-04-13 (×3): 80 mg via ORAL
  Filled 2014-04-10 (×3): qty 2

## 2014-04-10 MED ORDER — ONDANSETRON HCL 4 MG/2ML IJ SOLN
INTRAMUSCULAR | Status: DC | PRN
  Administered 2014-04-10: 4 mg via INTRAVENOUS

## 2014-04-10 MED ORDER — FENTANYL CITRATE 0.05 MG/ML IJ SOLN
INTRAMUSCULAR | Status: DC | PRN
  Administered 2014-04-10: 50 ug via INTRAVENOUS

## 2014-04-10 MED ORDER — OXYCODONE HCL 5 MG PO TABS
15.0000 mg | ORAL_TABLET | ORAL | Status: DC
  Administered 2014-04-11: 5 mg via ORAL
  Filled 2014-04-10: qty 3

## 2014-04-10 MED ORDER — WARFARIN - PHARMACIST DOSING INPATIENT: Freq: Every day | Status: DC

## 2014-04-10 MED ORDER — FENTANYL CITRATE 0.05 MG/ML IJ SOLN
INTRAMUSCULAR | Status: AC
  Filled 2014-04-10: qty 2

## 2014-04-10 MED ORDER — DEXAMETHASONE SODIUM PHOSPHATE 10 MG/ML IJ SOLN
10.0000 mg | Freq: Once | INTRAMUSCULAR | Status: AC
  Administered 2014-04-11: 10 mg via INTRAVENOUS
  Filled 2014-04-10: qty 1

## 2014-04-10 MED ORDER — CEFAZOLIN SODIUM-DEXTROSE 2-3 GM-% IV SOLR
2.0000 g | Freq: Four times a day (QID) | INTRAVENOUS | Status: AC
  Administered 2014-04-10 – 2014-04-11 (×2): 2 g via INTRAVENOUS
  Filled 2014-04-10 (×2): qty 50

## 2014-04-10 MED ORDER — GABAPENTIN 300 MG PO CAPS
300.0000 mg | ORAL_CAPSULE | Freq: Three times a day (TID) | ORAL | Status: DC
  Administered 2014-04-10 – 2014-04-13 (×5): 300 mg via ORAL
  Filled 2014-04-10 (×10): qty 1

## 2014-04-10 MED ORDER — HYDROMORPHONE HCL PF 1 MG/ML IJ SOLN
INTRAMUSCULAR | Status: AC
  Filled 2014-04-10: qty 2

## 2014-04-10 MED ORDER — 0.9 % SODIUM CHLORIDE (POUR BTL) OPTIME
TOPICAL | Status: DC | PRN
  Administered 2014-04-10: 1000 mL

## 2014-04-10 MED ORDER — ACETAMINOPHEN 10 MG/ML IV SOLN
1000.0000 mg | Freq: Once | INTRAVENOUS | Status: AC
  Administered 2014-04-10: 1000 mg via INTRAVENOUS
  Filled 2014-04-10: qty 100

## 2014-04-10 MED ORDER — OXYCODONE HCL 5 MG/5ML PO SOLN
5.0000 mg | Freq: Once | ORAL | Status: DC | PRN
  Filled 2014-04-10: qty 5

## 2014-04-10 MED ORDER — MIDAZOLAM HCL 5 MG/5ML IJ SOLN
INTRAMUSCULAR | Status: DC | PRN
  Administered 2014-04-10: 2 mg via INTRAVENOUS

## 2014-04-10 MED ORDER — DEXAMETHASONE SODIUM PHOSPHATE 10 MG/ML IJ SOLN
INTRAMUSCULAR | Status: DC | PRN
  Administered 2014-04-10: 10 mg via INTRAVENOUS

## 2014-04-10 MED ORDER — METHOCARBAMOL 1000 MG/10ML IJ SOLN
500.0000 mg | Freq: Four times a day (QID) | INTRAVENOUS | Status: DC | PRN
  Administered 2014-04-10: 500 mg via INTRAVENOUS
  Filled 2014-04-10: qty 5

## 2014-04-10 MED ORDER — DIPHENHYDRAMINE HCL 25 MG PO CAPS: 25.0000 mg | ORAL_CAPSULE | Freq: Four times a day (QID) | ORAL | Status: DC | PRN

## 2014-04-10 MED ORDER — LACTATED RINGERS IV SOLN
INTRAVENOUS | Status: DC | PRN
  Administered 2014-04-10 (×2): via INTRAVENOUS

## 2014-04-10 MED ORDER — CEFAZOLIN SODIUM-DEXTROSE 2-3 GM-% IV SOLR
INTRAVENOUS | Status: AC
  Filled 2014-04-10: qty 50

## 2014-04-10 MED ORDER — TRAZODONE HCL 100 MG PO TABS
100.0000 mg | ORAL_TABLET | Freq: Every day | ORAL | Status: DC
  Administered 2014-04-10: 100 mg via ORAL
  Filled 2014-04-10 (×4): qty 1

## 2014-04-10 MED ORDER — HYDROMORPHONE HCL PF 1 MG/ML IJ SOLN
0.2500 mg | INTRAMUSCULAR | Status: DC | PRN
  Administered 2014-04-10 (×2): 0.5 mg via INTRAVENOUS

## 2014-04-10 MED ORDER — CEFAZOLIN SODIUM-DEXTROSE 2-3 GM-% IV SOLR
2.0000 g | INTRAVENOUS | Status: AC
  Administered 2014-04-10: 2 g via INTRAVENOUS

## 2014-04-10 MED ORDER — MENTHOL 3 MG MT LOZG
1.0000 | LOZENGE | OROMUCOSAL | Status: DC | PRN
  Filled 2014-04-10: qty 9

## 2014-04-10 MED ORDER — FERROUS SULFATE 325 (65 FE) MG PO TABS
325.0000 mg | ORAL_TABLET | Freq: Three times a day (TID) | ORAL | Status: DC
  Administered 2014-04-11 – 2014-04-13 (×7): 325 mg via ORAL
  Filled 2014-04-10 (×10): qty 1

## 2014-04-10 SURGICAL SUPPLY — 61 items
BAG ZIPLOCK 12X15 (MISCELLANEOUS) ×3 IMPLANT
BIT DRILL FLEXIBLE 35 (BIT) ×1
BIT DRILL FLEXIBLE 35MM (BIT) ×1 IMPLANT
BLADE SAW SGTL 18X1.27X75 (BLADE) IMPLANT
BLADE SAW SGTL 18X1.27X75MM (BLADE)
BRUSH FEMORAL CANAL (MISCELLANEOUS) IMPLANT
DERMABOND ADVANCED (GAUZE/BANDAGES/DRESSINGS) ×2
DERMABOND ADVANCED .7 DNX12 (GAUZE/BANDAGES/DRESSINGS) ×1 IMPLANT
DRAPE INCISE IOBAN 85X60 (DRAPES) ×3 IMPLANT
DRAPE ORTHO SPLIT 77X108 STRL (DRAPES) ×4
DRAPE POUCH INSTRU U-SHP 10X18 (DRAPES) ×3 IMPLANT
DRAPE SURG 17X11 SM STRL (DRAPES) ×3 IMPLANT
DRAPE SURG ORHT 6 SPLT 77X108 (DRAPES) ×2 IMPLANT
DRAPE U-SHAPE 47X51 STRL (DRAPES) ×3 IMPLANT
DRILL BIT FLEXIBLE 35MM (BIT) ×2
DRSG AQUACEL AG ADV 3.5X10 (GAUZE/BANDAGES/DRESSINGS) IMPLANT
DRSG AQUACEL AG ADV 3.5X14 (GAUZE/BANDAGES/DRESSINGS) IMPLANT
DRSG EMULSION OIL 3X16 NADH (GAUZE/BANDAGES/DRESSINGS) ×3 IMPLANT
DRSG MEPILEX BORDER 4X12 (GAUZE/BANDAGES/DRESSINGS) ×3 IMPLANT
DRSG MEPILEX BORDER 4X4 (GAUZE/BANDAGES/DRESSINGS) IMPLANT
DRSG MEPILEX BORDER 4X8 (GAUZE/BANDAGES/DRESSINGS) ×6 IMPLANT
DRSG TEGADERM 4X4.75 (GAUZE/BANDAGES/DRESSINGS) ×3 IMPLANT
DURAPREP 26ML APPLICATOR (WOUND CARE) ×3 IMPLANT
ELECT BLADE TIP CTD 4 INCH (ELECTRODE) ×3 IMPLANT
ELECT REM PT RETURN 9FT ADLT (ELECTROSURGICAL) ×3
ELECTRODE REM PT RTRN 9FT ADLT (ELECTROSURGICAL) ×1 IMPLANT
FACESHIELD WRAPAROUND (MASK) ×9 IMPLANT
GAUZE SPONGE 2X2 8PLY STRL LF (GAUZE/BANDAGES/DRESSINGS) IMPLANT
GLOVE BIOGEL PI IND STRL 7.5 (GLOVE) ×1 IMPLANT
GLOVE BIOGEL PI IND STRL 8 (GLOVE) ×1 IMPLANT
GLOVE BIOGEL PI INDICATOR 7.5 (GLOVE) ×2
GLOVE BIOGEL PI INDICATOR 8 (GLOVE) ×2
GLOVE ECLIPSE 8.0 STRL XLNG CF (GLOVE) ×6 IMPLANT
GOWN SPEC L3 XXLG W/TWL (GOWN DISPOSABLE) ×6 IMPLANT
GOWN STRL REUS W/TWL LRG LVL3 (GOWN DISPOSABLE) ×3 IMPLANT
HANDPIECE INTERPULSE COAX TIP (DISPOSABLE)
HEAD FEM 36MM 12/14 HIP (Head) ×3 IMPLANT
KIT BASIN OR (CUSTOM PROCEDURE TRAY) ×3 IMPLANT
LINER 36X54MM (Hips) ×3 IMPLANT
LINER 54MM HIP (Liner) ×3 IMPLANT
MANIFOLD NEPTUNE II (INSTRUMENTS) ×3 IMPLANT
NS IRRIG 1000ML POUR BTL (IV SOLUTION) ×6 IMPLANT
PACK TOTAL JOINT (CUSTOM PROCEDURE TRAY) ×3 IMPLANT
POSITIONER SURGICAL ARM (MISCELLANEOUS) ×3 IMPLANT
PRESSURIZER FEMORAL UNIV (MISCELLANEOUS) IMPLANT
SCREW 6.5X25MM (Screw) ×3 IMPLANT
SCREW CAN HEAD 6.5MM HIP (Screw) ×3 IMPLANT
SET HNDPC FAN SPRY TIP SCT (DISPOSABLE) IMPLANT
SPONGE GAUZE 2X2 STER 10/PKG (GAUZE/BANDAGES/DRESSINGS)
SPONGE LAP 18X18 X RAY DECT (DISPOSABLE) IMPLANT
SPONGE LAP 4X18 X RAY DECT (DISPOSABLE) IMPLANT
STAPLER VISISTAT 35W (STAPLE) ×3 IMPLANT
SUCTION FRAZIER TIP 10 FR DISP (SUCTIONS) ×3 IMPLANT
SUT VIC AB 1 CT1 36 (SUTURE) ×9 IMPLANT
SUT VIC AB 2-0 CT1 27 (SUTURE) ×6
SUT VIC AB 2-0 CT1 TAPERPNT 27 (SUTURE) ×3 IMPLANT
SUT VLOC 180 0 24IN GS25 (SUTURE) ×6 IMPLANT
TOWEL OR 17X26 10 PK STRL BLUE (TOWEL DISPOSABLE) ×6 IMPLANT
TOWER CARTRIDGE SMART MIX (DISPOSABLE) IMPLANT
TRAY FOLEY CATH 14FRSI W/METER (CATHETERS) ×3 IMPLANT
WATER STERILE IRR 1500ML POUR (IV SOLUTION) ×3 IMPLANT

## 2014-04-10 NOTE — Anesthesia Postprocedure Evaluation (Signed)
Anesthesia Post Note  Patient: Tina Weeks  Procedure(s) Performed: Procedure(s) (LRB): TOTAL LEFT  HIP REVISION (Left)  Anesthesia type: General  Patient location: PACU  Post pain: Pain level controlled  Post assessment: Post-op Vital signs reviewed  Last Vitals: BP 106/69  Pulse 69  Temp(Src) 36.6 C (Oral)  Resp 16  SpO2 97%  Post vital signs: Reviewed  Level of consciousness: sedated  Complications: No apparent anesthesia complications

## 2014-04-10 NOTE — Interval H&P Note (Signed)
History and Physical Interval Note:  04/10/2014 2:45 PM  Tina Weeks  has presented today for surgery, with the diagnosis of FAILED LEFT TOTAL HIP  The various methods of treatment have been discussed with the patient and family. After consideration of risks, benefits and other options for treatment, the patient has consented to  Procedure(s): TOTAL LEFT  HIP REVISION (Left) as a surgical intervention .  The patient's history has been reviewed, patient examined, no change in status, stable for surgery.  I have reviewed the patient's chart and labs.  Questions were answered to the patient's satisfaction.     Shelda PalLIN,Jovaughn Wojtaszek D

## 2014-04-10 NOTE — Transfer of Care (Signed)
Immediate Anesthesia Transfer of Care Note  Patient: Tina Weeks  Procedure(s) Performed: Procedure(s) (LRB): TOTAL LEFT  HIP REVISION (Left)  Patient Location: PACU  Anesthesia Type: General  Level of Consciousness: sedated, patient cooperative and responds to stimulation  Airway & Oxygen Therapy: Patient Spontanous Breathing and Patient connected to face mask oxgen  Post-op Assessment: Report given to PACU RN and Post -op Vital signs reviewed and stable  Post vital signs: Reviewed and stable  Complications: No apparent anesthesia complications

## 2014-04-10 NOTE — Progress Notes (Signed)
ANTICOAGULATION CONSULT NOTE - Initial Consult  Pharmacy Consult for Warfarin Indication: VTE px s/p total left hip revision   Allergies  Allergen Reactions  . Shellfish Allergy Hives    Patient Measurements:    Vital Signs: Temp: 97.8 F (36.6 C) (06/15 2000) BP: 113/71 mmHg (06/15 2000) Pulse Rate: 75 (06/15 2000)  Labs: No results found for this basename: HGB, HCT, PLT, APTT, LABPROT, INR, HEPARINUNFRC, CREATININE, CKTOTAL, CKMB, TROPONINI,  in the last 72 hours  CrCl is unknown because there is no height on file for the current visit.   Medical History: Past Medical History  Diagnosis Date  . Hypothyroidism   . Sleep apnea   . GERD (gastroesophageal reflux disease)   . Fibromyalgia   . Arthritis   . Encounter for blood transfusion   . Hepatitis     C-   years ago  . Anemia   . DDD (degenerative disc disease)     lumbar, cervical  . Bipolar disorder   . Chronic pain syndrome   . Anxiety   . Depression   . Asthma     intrensic    Medications:  Scheduled:  . buprenorphine  8 mg Sublingual TID  .  ceFAZolin (ANCEF) IV  2 g Intravenous Q6H  . celecoxib  200 mg Oral Q12H  . clonazePAM  1 mg Oral QID  . [START ON 04/11/2014] dexamethasone  10 mg Intravenous Once  . docusate sodium  100 mg Oral BID  . [START ON 04/11/2014] ferrous sulfate  325 mg Oral TID PC  . gabapentin  300 mg Oral TID  . HYDROmorphone      . hydrOXYzine  50 mg Oral QID  . [START ON 04/11/2014] levothyroxine  137 mcg Oral QAC breakfast  . oxyCODONE  15-30 mg Oral Q4H  . [START ON 04/11/2014] pantoprazole  80 mg Oral Q1200  . polyethylene glycol  17 g Oral BID  . pregabalin  300 mg Oral BID  . risperiDONE  4 mg Oral QHS  . traZODone  100 mg Oral QHS   Infusions:  . sodium chloride 0.9 % 1,000 mL with potassium chloride 10 mEq infusion     PRN: alum & mag hydroxide-simeth, bisacodyl, diphenhydrAMINE, HYDROmorphone (DILAUDID) injection, magnesium citrate, menthol-cetylpyridinium,  methocarbamol (ROBAXIN) IV, methocarbamol, metoCLOPramide (REGLAN) injection, metoCLOPramide, ondansetron (ZOFRAN) IV, ondansetron, phenol  Assessment:  53 yo caucasion F s/p Total Left Hip Revision on 6/15.  Patient to start warfarin for VTE prophylaxis.  Patient was not on any anticoagulations prior to admission per medication history.   Baseline PT/INR WNL on 6/10  Baseline CBC okay on 6/10  Goal of Therapy:  INR 2-3 Monitor platelets by anticoagulation protocol: Yes   Plan:  1.)  Warfarin 7.5 mg po x 1 tonight at 2200  2.) Daily PT/INR 3.) Warfarin education to be provided by pharmacist    Minh Jasper, Loma MessingMary Patricia PharmD Pager #: [phone] 9:00 PM 04/10/2014

## 2014-04-10 NOTE — Anesthesia Preprocedure Evaluation (Addendum)
Anesthesia Evaluation  Patient identified by MRN, date of birth, ID band Patient awake    Reviewed: Allergy & Precautions, H&P , NPO status , Patient's Chart, lab work & pertinent test results  Airway  TM Distance: >3 FB Neck ROM: Full    Dental  (+) Dental Advisory Given   Pulmonary asthma , sleep apnea , Current Smoker,          Cardiovascular negative cardio ROS  Rhythm:Regular Rate:Normal     Neuro/Psych PSYCHIATRIC DISORDERS Anxiety Depression Bipolar Disorder negative neurological ROS  negative psych ROS   GI/Hepatic negative GI ROS, Neg liver ROS, GERD-  Medicated,(+) Hepatitis -  Endo/Other  Hypothyroidism   Renal/GU negative Renal ROS     Musculoskeletal negative musculoskeletal ROS (+) Fibromyalgia -  Abdominal (+) + obese,   Peds  Hematology  (+) anemia ,   Anesthesia Other Findings   Reproductive/Obstetrics negative OB ROS                          Anesthesia Physical Anesthesia Plan  ASA: III  Anesthesia Plan: General   Post-op Pain Management:    Induction: Intravenous  Airway Management Planned: Oral ETT  Additional Equipment:   Intra-op Plan:   Post-operative Plan: Extubation in OR  Informed Consent: I have reviewed the patients History and Physical, chart, labs and discussed the procedure including the risks, benefits and alternatives for the proposed anesthesia with the patient or authorized representative who has indicated his/her understanding and acceptance.   Dental advisory given  Plan Discussed with: CRNA  Anesthesia Plan Comments:       Anesthesia Quick Evaluation

## 2014-04-11 ENCOUNTER — Encounter (HOSPITAL_COMMUNITY): Payer: Self-pay | Admitting: Orthopedic Surgery

## 2014-04-11 LAB — CBC
HCT: 36.7 % (ref 36.0–46.0)
Hemoglobin: 11.6 g/dL — ABNORMAL LOW (ref 12.0–15.0)
MCH: 26.5 pg (ref 26.0–34.0)
MCHC: 31.6 g/dL (ref 30.0–36.0)
MCV: 84 fL (ref 78.0–100.0)
PLATELETS: 142 10*3/uL — AB (ref 150–400)
RBC: 4.37 MIL/uL (ref 3.87–5.11)
RDW: 16.8 % — ABNORMAL HIGH (ref 11.5–15.5)
WBC: 11.5 10*3/uL — ABNORMAL HIGH (ref 4.0–10.5)

## 2014-04-11 LAB — BASIC METABOLIC PANEL
BUN: 10 mg/dL (ref 6–23)
CO2: 24 mEq/L (ref 19–32)
Calcium: 8.5 mg/dL (ref 8.4–10.5)
Chloride: 103 mEq/L (ref 96–112)
Creatinine, Ser: 0.62 mg/dL (ref 0.50–1.10)
GFR calc Af Amer: >90 mL/min (ref >90)
GFR calc non Af Amer: >90 mL/min (ref >90)
GLUCOSE: 146 mg/dL — AB (ref 70–99)
POTASSIUM: 5 meq/L (ref 3.7–5.3)
Sodium: 140 mEq/L (ref 137–147)

## 2014-04-11 LAB — PROTIME-INR
INR: 1.1 (ref 0.00–1.49)
PROTHROMBIN TIME: 14 s (ref 11.6–15.2)

## 2014-04-11 MED ORDER — NALOXONE HCL 0.4 MG/ML IJ SOLN
0.1000 mg | INTRAMUSCULAR | Status: DC | PRN
  Administered 2014-04-11 (×3): 0.1 mg via INTRAVENOUS
  Administered 2014-04-12: 0.2 mg via INTRAVENOUS
  Filled 2014-04-11 (×2): qty 1

## 2014-04-11 MED ORDER — NALOXONE HCL 0.4 MG/ML IJ SOLN
INTRAMUSCULAR | Status: AC
  Administered 2014-04-11: 0.1 mg
  Filled 2014-04-11: qty 1

## 2014-04-11 MED ORDER — WARFARIN SODIUM 7.5 MG PO TABS
7.5000 mg | ORAL_TABLET | Freq: Once | ORAL | Status: AC
  Administered 2014-04-11: 7.5 mg via ORAL
  Filled 2014-04-11: qty 1

## 2014-04-11 MED ORDER — OXYCODONE HCL 5 MG PO TABS: 5.0000 mg | ORAL_TABLET | ORAL | Status: DC

## 2014-04-11 MED ORDER — CLONAZEPAM 1 MG PO TABS
1.0000 mg | ORAL_TABLET | Freq: Three times a day (TID) | ORAL | Status: DC | PRN
  Administered 2014-04-13: 1 mg via ORAL
  Filled 2014-04-11: qty 1

## 2014-04-11 MED ORDER — OXYCODONE HCL 5 MG PO TABS
5.0000 mg | ORAL_TABLET | ORAL | Status: DC | PRN
  Administered 2014-04-12 (×3): 10 mg via ORAL
  Administered 2014-04-13: 20 mg via ORAL
  Administered 2014-04-13: 10 mg via ORAL
  Administered 2014-04-13: 20 mg via ORAL
  Filled 2014-04-11: qty 4
  Filled 2014-04-11 (×4): qty 2
  Filled 2014-04-11: qty 4
  Filled 2014-04-11: qty 2

## 2014-04-11 NOTE — Plan of Care (Signed)
Problem: Phase II Progression Outcomes Goal: Ambulates Outcome: Not Progressing Pt too Lethargic to ambulate. Pt got to the side of the bed with Physical Therapy.

## 2014-04-11 NOTE — Brief Op Note (Signed)
04/10/2014  8:46 AM  PATIENT:  Tina Weeks  53 y.o. female  PRE-OPERATIVE DIAGNOSIS:  FAILED LEFT TOTAL HIP  POST-OPERATIVE DIAGNOSIS:  FAILED LEFT TOTAL HIP, metallosis, mal-positioned component  PROCEDURE:  Procedure(s): TOTAL LEFT  HIP REVISION (Left) Arta BruceSmith Nephew, see dictated report  SURGEON:  Surgeon(s) and Role:    * Shelda PalMatthew D Patrena Santalucia, MD - Primary  PHYSICIAN ASSISTANT: Lanney GinsMatthew Babish, PA-C  ANESTHESIA:   general  EBL:     BLOOD ADMINISTERED:none  DRAINS: none   LOCAL MEDICATIONS USED:  NONE  SPECIMEN:  No Specimen  DISPOSITION OF SPECIMEN:  N/A  COUNTS:  YES  TOURNIQUET:  * No tourniquets in log *  DICTATION: .Other Dictation: Dictation Number [id number]  PLAN OF CARE: Admit to inpatient   PATIENT DISPOSITION:  PACU - hemodynamically stable.   Delay start of Pharmacological VTE agent (>24hrs) due to surgical blood loss or risk of bleeding: no

## 2014-04-11 NOTE — Progress Notes (Signed)
Utilization review completed.  

## 2014-04-11 NOTE — Progress Notes (Signed)
Pt has been lethargic all day, yet arousable. Pt had delay speech but not abnormal neuro signs. Pt stated she didn't sleep well. MD and PA were paged a few times to ask about Home medications as she takes a lot of sedatives. A 500 NS bolus was given for a low b/p and narcan 0.3mg  given. Pt more arousable.

## 2014-04-11 NOTE — Evaluation (Signed)
Physical Therapy Evaluation Patient Details Name: Tina Weeks CourseClaudia Currie MRN: [id number] DOB: April 07, 1961 Today's Date: 04/11/2014   History of Present Illness  Pt is a 53 year old female s/p L total hip revision  Clinical Impression  Patient is s/p L total hip revision surgery resulting in functional limitations due to the deficits listed below (see PT Problem List).  Patient will benefit from skilled PT to increase their independence and safety with mobility to allow discharge to the venue listed below.  Pt very limited today with mobility due to grogginess and lethargy, RN into room after session.  Will continue to update recommendations as pt progresses.     Follow Up Recommendations SNF    Equipment Recommendations  Other (comment) (unknown at this time)    Recommendations for Other Services       Precautions / Restrictions Precautions Precautions: Posterior Hip Restrictions Weight Bearing Restrictions: Yes LLE Weight Bearing: Partial weight bearing      Mobility  Bed Mobility Overal bed mobility: Needs Assistance;+2 for physical assistance Bed Mobility: Supine to Sit;Sit to Supine     Supine to sit: +2 for physical assistance;Total assist Sit to supine: +2 for physical assistance;Total assist   General bed mobility comments: multimodal cues for technique however pt provided no assistance  Transfers                    Ambulation/Gait                Stairs            Wheelchair Mobility    Modified Rankin (Stroke Patients Only)       Balance Overall balance assessment: Needs assistance Sitting-balance support: Feet supported Sitting balance-Leahy Scale: Poor Sitting balance - Comments: lethargic and did not improve upon sitting, assist for trunk control at first, able to maintain min/guard for a couple minutes                                     Pertinent Vitals/Pain Pt sleeping upon entering room and quickly falls back to  sleep during session, L hip pain during movement however pt unable to rate, repositioned back in supine end of session    Home Living Family/patient expects to be discharged to:: Private residence                 Additional Comments: pt unable to answer questions due to cognition today    Prior Function                 Hand Dominance        Extremity/Trunk Assessment               Lower Extremity Assessment: Generalized weakness;Difficult to assess due to impaired cognition         Communication      Cognition Arousal/Alertness: Lethargic   Overall Cognitive Status: No family/caregiver present to determine baseline cognitive functioning                 General Comments: pt unable to answer home living questions, occasionally responded to yes/no questions, very lethargic    General Comments      Exercises        Assessment/Plan    PT Assessment Patient needs continued PT services  PT Diagnosis Difficulty walking;Acute pain   PT Problem List Decreased strength;Decreased range of motion;Decreased activity tolerance;Decreased mobility;Decreased knowledge  of precautions;Decreased knowledge of use of DME;Pain;Obesity  PT Treatment Interventions Functional mobility training;Gait training;DME instruction;Patient/family education;Therapeutic activities;Therapeutic exercise;Stair training   PT Goals (Current goals can be found in the Care Plan section) Acute Rehab PT Goals PT Goal Formulation: Patient unable to participate in goal setting Time For Goal Achievement: 04/18/14 Potential to Achieve Goals: Good    Frequency 7X/week   Barriers to discharge        Co-evaluation               End of Session   Activity Tolerance: Patient limited by lethargy Patient left: in bed;with call bell/phone within reach Nurse Communication: Mobility status;Precautions;Weight bearing status         Time: 1105-1120 PT Time Calculation (min): 15  min   Charges:   PT Evaluation $Initial PT Evaluation Tier I: 1 Procedure PT Treatments $Therapeutic Activity: 8-22 mins   PT G Codes:          Tywone Bembenek,KATHrine E 04/11/2014, 12:35 PM Zenovia JarredKati Jayel Inks, PT, DPT 04/11/2014 Pager: [phone]

## 2014-04-11 NOTE — Progress Notes (Signed)
ANTICOAGULATION CONSULT NOTE  Pharmacy Consult for Warfarin Indication: VTE px s/p total left hip revision   Allergies  Allergen Reactions  . Shellfish Allergy Hives    Patient Measurements: Height: 5\' 4"  (162.6 cm) Weight: 220 lb 14.4 oz (100.2 kg) IBW/kg (Calculated) : 54.7  Vital Signs: Temp: 97.6 F (36.4 C) (06/16 0943) Temp src: Oral (06/16 0943) BP: 90/58 mmHg (06/16 0943) Pulse Rate: 59 (06/16 0943)  Labs:  Recent Labs  52/16/15 0443 04/11/14 0620  HGB  --  11.6*  HCT  --  36.7  PLT  --  142*  LABPROT  --  14.0  INR  --  1.10  CREATININE 0.62  --     Estimated Creatinine Clearance: 95.7 ml/min (by C-G formula based on Cr of 0.62).   Medical History: Past Medical History  Diagnosis Date  . Hypothyroidism   . Sleep apnea   . GERD (gastroesophageal reflux disease)   . Fibromyalgia   . Arthritis   . Encounter for blood transfusion   . Hepatitis     C-   years ago  . Anemia   . DDD (degenerative disc disease)     lumbar, cervical  . Bipolar disorder   . Chronic pain syndrome   . Anxiety   . Depression   . Asthma     intrensic    Medications:  Scheduled:  . buprenorphine  8 mg Sublingual TID  . celecoxib  200 mg Oral Q12H  . clonazePAM  1 mg Oral QID  . docusate sodium  100 mg Oral BID  . ferrous sulfate  325 mg Oral TID PC  . gabapentin  300 mg Oral TID  . hydrOXYzine  50 mg Oral QID  . levothyroxine  137 mcg Oral QAC breakfast  . oxyCODONE  15-30 mg Oral Q4H  . pantoprazole  80 mg Oral Q1200  . polyethylene glycol  17 g Oral BID  . pregabalin  300 mg Oral BID  . risperiDONE  4 mg Oral QHS  . traZODone  100 mg Oral QHS  . Warfarin - Pharmacist Dosing Inpatient   Does not apply q1800   Infusions:  . sodium chloride 0.9 % 1,000 mL with potassium chloride 10 mEq infusion 100 mL/hr (04/11/14 0904)   PRN: alum & mag hydroxide-simeth, bisacodyl, diphenhydrAMINE, HYDROmorphone (DILAUDID) injection, menthol-cetylpyridinium, methocarbamol  (ROBAXIN) IV, methocarbamol, metoCLOPramide (REGLAN) injection, metoCLOPramide, ondansetron (ZOFRAN) IV, ondansetron, phenol  Warfarin doses administered this admission: 6/15: 7.5mg   Assessment: 53 yo F s/p Total Left Hip Revision on 6/15. Pharmacy was asked to assist with warfarin dosing for VTE prophylaxis. Patient was not on any anticoagulations prior to admission.  Baseline PT/INR and H/H, Pltc were all WNL.  Goal of Therapy:  INR 2-3  6/16: POD#2 Minimal INR response to initial warfarin dose, as expected. H/H and pltc acceptable. Taking small amounts regular diet. No overt bleeding reported in chart notes.'  Plan: 1. Warfarin 7.5 mg PO x 1 today at 1800 2. PT/INR daily while inpatient.   Elie Goodyandy Absher, PharmD, BCPS Pager: [phone] 04/11/2014  1:23 PM

## 2014-04-11 NOTE — Progress Notes (Signed)
Patient ID: Clemencia CourseClaudia Timberman, female   DOB: October 10, 1961, 53 y.o.   MRN: [id number] Subjective: 1 Day Post-Op Procedure(s) (LRB): TOTAL LEFT  HIP REVISION (Left)    Patient reports pain as moderate.  Sleeping this am with BPAP on and in place  Objective:   VITALS:   Filed Vitals:   04/11/14 0943  BP: 90/58  Pulse: 59  Temp: 97.6 F (36.4 C)  Resp: 12    Neurovascular intact Incision: dressing C/D/I  LABS  Recent Labs  04/11/14 0620  HGB 11.6*  HCT 36.7  WBC 11.5*  PLT 142*     Recent Labs  04/11/14 0443  NA 140  K 5.0  BUN 10  CREATININE 0.62  GLUCOSE 146*     Recent Labs  04/11/14 0620  INR 1.10     Assessment/Plan: 1 Day Post-Op Procedure(s) (LRB): TOTAL LEFT  HIP REVISION (Left)   Advance diet Up with therapy Plan for discharge tomorrow to SNF versus home depending on therapy progress Continue efforts at pain management

## 2014-04-11 NOTE — Op Note (Signed)
NAMHorton Weeks:  Tina Weeks, Tina             ACCOUNT NO.:  [id number]  MEDICAL RECORD NO.:  [id number]  LOCATION:  1610                         [facility]  PHYSICIAN:  Madlyn FrankelMatthew D. Charlann Boxerlin, M.D.  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  FINDINGS:  Please see body of the report as well as indication for surgical procedure.  PROCEDURE:  Revision of left total hip arthroplasty utilizing Smith and Nephew components as she had previously revised left hip with Katrinka BlazingSmith and Nephew components.  I used a size 54 hemispherical shell, a STIKTITE coated shell cup multi hole with a 36 +4 10-degree lip liner as well as 2 cancellous screws, and a 36+ 8 Oxinium ball.  SURGEON:  Madlyn FrankelMatthew D. Charlann Boxerlin, MD  ASSISTANT:  Lanney GinsMatthew Babish, PA-C  Note that Mr. Carmon SailsBabish was present for the entirety of the case, preoperative position, perioperative management of the operative extremity, general facilitation of the case, and primary wound closure.  ANESTHESIA:  General.  SPECIMENS:  Per attorney request the metal implants were sent to Pathology for evaluation prior to be returned to the patient or attorney.  BLOOD LOSS:  Approximately 400 mL.  COMPLICATIONS:  None.  DRAINS:  None.  INDICATION FOR PROCEDURE:  Tina Weeks is a 53 year old female, [place]  She had previous bilateral total hip arthroplasties.  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  She has had persistent pain in the left hip for which she  has seen evaluated and I worked her up with a bone scan showing no evidence of any loosening of the components.  Her lab work was negative for infectious concerns with CRP and C. reactive proteins within normal limits.  Given these findings, we had a lengthy discussion about indications for surgical procedure. Radiographically, I could tell that her acetabular component was significantly anteverted in relation for contralateral hip for what would be considered negative.  Based on the persistence of her pain, I felt that I could at least debride her hip, evaluate the hip components and revise the acetabular component for better orientation removing metal on metal components and any concerns there.  She wished to have this performed.  I discussed the risks of infection based on her history.  Risks of dislocation and revision hip surgery setting, the risk of DVT and risk of persistent pain most important given the fact she is on chronic pain meds.  PROCEDURE IN DETAIL:  The patient was brought to the operative theater. Once adequate anesthesia and preoperative antibiotics administered, she was positioned into the right lateral decubitus position with the left side up.  The left lower extremity was then prepped and draped in sterile fashion.  Time-out was performed identifying the patient, planned procedure, and extremity.  Her old incision noted to  be somewhat caved in and scarred.  I excised her old scar and soft tissues.  Soft tissue planes were recreated to the iliotibial band and gluteal fascia which were then split and we encountered clear synovial type fluid in this area, no evidence of infection.  Posterior pseudocapsule was taken down off the posterior capsule.  Initial exposure was obtained on posterior 2/3 of the hip exposing the hip joint, removing the scar, replacing retractors without evidence of any infection or complication.  Once this was carried out, the hip was  dislocated, the femoral head was removed.  The femoral stem was noted to be stable with very little anteversion.  Following further efforts at exposure of the ilium as well as anteriorly, I was able to mobilize the femur well enough to remove the acetabular liner which was metal.  Her cup was noted to be significantly forward flexed and abducted and flat makes it difficult to remove the 2 screws inside her acetabulum but these were removed.  We then used a combination of [place] as well as the Innomed explant system to remove the acetabular shell.  Once this was removed, we were able to remove the acetabular shell without significant bone loss, it was a 52-mm cup.  I thus selected a 53-mm reamer and prepared the acetabulum for a 54 cup. The final 54 cup was then impacted and to my direct vision probably with 25-30 degrees of anteversion beneath the anterior rim significantly less than what was previously done and I chose this amount based on her lack of anteversion of the femoral stem.  I did place 2 cancellous screws.  I was able to directly visualize through the multi-hole cup that the cup was well seated.  A trial liner was placed and trial reduction was carried out.  During the trial reductions, I ended up selecting the 20-degree lip liner based on her lack of anteversion of the femoral component and then selected a +8 ball and compared to what was previously taken as a +5 in order to provide some enhanced stability to prevent dislocation.  Sacrificed leg lengths  concerns for stability in this case and revision setting and persistent pain at this point.  At this point, I have revised the metal-on-metal component that appeared to be malpositioned to an Oxinium femoral head on polyethylene.  I feel at this point that she has a stable hip as best I could ascertain the range of motion assessment as well as our procedure performed.  At this point and throughout the case, we had  irrigated out the hip with normal saline solution.  I did reapproximate the pseudo capsular tissue as appropriate in the posterior aspect of the hip.  I then reapproximated the iliotibial band and gluteal fascia using #1 Vicryl and 0 V-Loc suture.  The remainder of the wound was closed with 2-0 Vicryl and staples on the skin.  The skin was cleaned, dried, dressed sterilely using Mepilex.  I will try to change the dressing out prior to discharge with Aquacel dressing.  She was extubated and brought to the recovery room in stable condition tolerating the procedure well.     Madlyn FrankelMatthew D. Charlann Boxerlin, M.D.     MDO/MEDQ  D:  04/11/2014  T:  04/11/2014  Job:  [id number]

## 2014-04-11 NOTE — Progress Notes (Signed)
OT Cancellation Note  Patient Details Name: Clemencia CourseClaudia Ayo MRN: [id number] DOB: August 01, 1961   Cancelled Treatment:    Reason Eval/Treat Not Completed: Fatigue/lethargy limiting ability to participate  Galen ManilaSpencer, Mansa Willers Jeanette 04/11/2014, 2:39 PM

## 2014-04-12 DIAGNOSIS — D500 Iron deficiency anemia secondary to blood loss (chronic): Secondary | ICD-10-CM | POA: Diagnosis not present

## 2014-04-12 DIAGNOSIS — E669 Obesity, unspecified: Secondary | ICD-10-CM | POA: Diagnosis present

## 2014-04-12 LAB — HEPATIC FUNCTION PANEL
ALT: 17 U/L (ref 0–35)
AST: 21 U/L (ref 0–37)
Albumin: 2.6 g/dL — ABNORMAL LOW (ref 3.5–5.2)
Alkaline Phosphatase: 53 U/L (ref 39–117)
Bilirubin, Direct: <0.2 mg/dL (ref 0.0–0.3)
Total Bilirubin: <0.2 mg/dL — ABNORMAL LOW (ref 0.3–1.2)
Total Protein: 5.7 g/dL — ABNORMAL LOW (ref 6.0–8.3)

## 2014-04-12 LAB — CBC
HCT: 34.2 % — ABNORMAL LOW (ref 36.0–46.0)
Hemoglobin: 10.8 g/dL — ABNORMAL LOW (ref 12.0–15.0)
MCH: 26.5 pg (ref 26.0–34.0)
MCHC: 31.6 g/dL (ref 30.0–36.0)
MCV: 83.8 fL (ref 78.0–100.0)
PLATELETS: 123 10*3/uL — AB (ref 150–400)
RBC: 4.08 MIL/uL (ref 3.87–5.11)
RDW: 17.1 % — ABNORMAL HIGH (ref 11.5–15.5)
WBC: 9.5 10*3/uL (ref 4.0–10.5)

## 2014-04-12 LAB — BASIC METABOLIC PANEL
BUN: 11 mg/dL (ref 6–23)
CHLORIDE: 106 meq/L (ref 96–112)
CO2: 26 meq/L (ref 19–32)
Calcium: 8.6 mg/dL (ref 8.4–10.5)
Creatinine, Ser: 0.69 mg/dL (ref 0.50–1.10)
GFR calc Af Amer: >90 mL/min (ref >90)
GFR calc non Af Amer: >90 mL/min (ref >90)
Glucose, Bld: 103 mg/dL — ABNORMAL HIGH (ref 70–99)
Potassium: 4.6 mEq/L (ref 3.7–5.3)
SODIUM: 140 meq/L (ref 137–147)

## 2014-04-12 LAB — PROTIME-INR
INR: 2.52 — AB (ref 0.00–1.49)
PROTHROMBIN TIME: 26.3 s — AB (ref 11.6–15.2)

## 2014-04-12 NOTE — Evaluation (Signed)
Occupational Therapy Evaluation Patient Details Name: Clemencia CourseClaudia Coltrin MRN: [id number] DOB: 1960-11-14 Today's Date: 04/12/2014    History of Present Illness Pt is a 53 year old female s/p L total hip revision   Clinical Impression   Eval limited due to pt confusion and emotional lability (pt tearful). Pt demonstrates decline in function and safety with ADLs and ADL mobility with decreased strength, balance and endurance. Pt with confusion  and decreased attention. Pt required OT repeating instructions/asking PLOF questions multiple times before pt answered and was unable to answer pain level or recall hip precautions or weight bearing status. Pt repeating that she was from [place] x 5. Pt required verbal cues to initiate ADLs sitting EOB. Pt states that she knew she was in the hospital due to surgery. During bed mobility pt reuqired multiple cues to intiaite movement of LEs towards R side of bed as she was moving towards L side, assist to elevate trunk. When returning to supine, pt able to positon LEs onto bed without any physical assist, however required physical assist for positioning in bed and  verbal cues to use [facility]pe[facility]    Follow Up Recommendations  SNF;Supervision/Assistance - 24 hour    Equipment Recommendations  Tub/shower seat    Recommendations for Other Services       Precautions / Restrictions Precautions Precautions: Posterior Hip Precaution Comments: Pt unable to recall any hip precatuions or weight bearing status. Reviewed posterior hip precautions and PWB with pt Restrictions Weight Bearing Restrictions: Yes LLE Weight Bearing: Partial weight bearing      Mobility Bed Mobility Overal bed mobility: Needs Assistance Bed Mobility: Supine to Sit;Sit to Supine     Supine to sit: Max assist Sit to supine: Min assist   General bed mobility comments: pt reuqired multiple cues to intiaite movement of LEs towards R side of bed as she was moving towards L  side, assist to elevate trunk. When returning to supine, pt able to positon LEs onto bed without any physical assist, however required physical assist for positioning in bed and  verbal cues to use trapeze [facility]  Transfers                      Balance   Sitting-balance support: No upper extremity supported;Feet supported Sitting balance-Leahy Scale: Fair                                      ADL Overall ADL's : Needs assistance/impaired     Grooming: Wash/dry hands;Wash/dry face;Sitting;Min guard;Set up Grooming Details (indicate cue type and reason): verbal cues for initiation Upper Body Bathing: Set up;Min guard;Sitting Upper Body Bathing Details (indicate cue type and reason): verbal cues for initiation Lower Body Bathing: Maximal assistance   Upper Body Dressing : Set up;Min guard;Sitting Upper Body Dressing Details (indicate cue type and reason): verbal cues for initiation Lower Body Dressing: Total assistance     Toilet Transfer Details (indicate cue type and reason): NT due to pt's confusion, will need 2 person assist         Functional mobility during ADLs: Moderate assistance;Maximal assistance;Cueing for sequencing General ADL Comments: verbal cues for initiation     Vision  wears reading glasses per pt report                   Perception Perception Perception Tested?: No  Praxis Praxis Praxis tested?: Not tested    Pertinent Vitals/Pain Pt unable to rate pain, states " it's up there". VSS     Hand Dominance Right   Extremity/Trunk Assessment Upper Extremity Assessment Upper Extremity Assessment: Generalized weakness;Difficult to assess due to impaired cognition           Communication Communication Communication: No difficulties   Cognition Arousal/Alertness: Awake/alert Behavior During Therapy: Restless;Anxious (pt tearful) Overall Cognitive Status: No family/caregiver present to determine baseline  cognitive functioning Area of Impairment: Attention;Memory;Following commands;Safety/judgement;Awareness;Problem solving Orientation Level: Place;Time;Disoriented to Current Attention Level: Alternating Memory: Decreased recall of precautions;Decreased short-term memory Following Commands: Follows multi-step commands inconsistently Safety/Judgement: Decreased awareness of safety;Decreased awareness of deficits   Problem Solving: Decreased initiation;Slow processing;Requires verbal cues General Comments: Pt confused and required OT repeating questions x 3 before pt would answer, sometimes not able to give an answer. Pt repeating that she was from [place] x 5   General Comments   pt confused, cooperative                Home Living Family/patient expects to be discharged to:: Private residence Living Arrangements: Spouse/significant other Available Help at Discharge: Family Type of Home: House Home Access: Stairs to enter Secretary/administratorntrance Stairs-Number of Steps: 5 Entrance Stairs-Rails: Right Home Layout: One level     Bathroom Shower/Tub: Chief Strategy OfficerTub/shower unit   Bathroom Toilet: Handicapped height     Home Equipment: Bedside commode;Walker - 2 wheels;Cane - single point   Additional Comments: pt confused with decreased attention, info provided per pt report      Prior Functioning/Environment Level of Independence: Independent with assistive device(s)        Comments: pt reports that she was independent with ADLs    OT Diagnosis: Generalized weakness;Acute pain   OT Problem List: Decreased strength;Decreased knowledge of use of DME or AE;Decreased knowledge of precautions;Decreased activity tolerance;Impaired balance (sitting and/or standing);Decreased safety awareness;Pain   OT Treatment/Interventions: Self-care/ADL training;Therapeutic exercise;Patient/family education;Neuromuscular education;Balance training;Therapeutic activities;DME and/or AE instruction    OT  Goals(Current goals can be found in the care plan section) Acute Rehab OT Goals Patient Stated Goal: " I want to go home" OT Goal Formulation: With patient Time For Goal Achievement: 04/19/14 Potential to Achieve Goals: Good ADL Goals Pt Will Perform Grooming: with set-up;sitting Pt Will Perform Upper Body Bathing: with set-up;sitting Pt Will Perform Lower Body Bathing: with mod assist;with caregiver independent in assisting;with adaptive equipment;sitting/lateral leans;sit to/from stand Pt Will Perform Upper Body Dressing: with set-up;sitting Pt Will Perform Lower Body Dressing: with mod assist;sitting/lateral leans;sit to/from stand;with caregiver independent in assisting;with adaptive equipment Pt Will Transfer to Toilet: with max assist;with mod assist;bedside commode Pt Will Perform Toileting - Clothing Manipulation and hygiene: with mod assist;sitting/lateral leans;sit to/from stand  OT Frequency: Min 2X/week   Barriers to D/C:    uncertain of pt's spouse/family can provide pt's curent level of care also considering pt's level of confusion                     End of Session    Activity Tolerance: Other (comment) (pt tearful, confused) Patient left: in bed with call bell in reach   Time: 0952-1016 OT Time Calculation (min): 24 min Charges:  OT General Charges $OT Visit: 1 Procedure OT Evaluation $Initial OT Evaluation Tier I: 1 Procedure OT Treatments $Therapeutic Activity: 8-22 mins G-Codes:    Galen ManilaSpencer, Madysyn Hanken Jeanette 04/12/2014, 10:35 AM

## 2014-04-12 NOTE — Progress Notes (Signed)
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 Shellfish Allergy Hives    Patient Measurements: Height: 5\' 4"  (162.6 cm) Weight: 220 lb 14.4 oz (100.2 kg) IBW/kg (Calculated) : 54.7  Vital Signs: Temp: 97.5 F (36.4 C) (06/17 1349) Temp src: Oral (06/17 1349) BP: 129/78 mmHg (06/17 1349) Pulse Rate: 91 (06/17 1349)  Labs:  Recent Labs  04/11/14 0443 04/11/14 0620 04/12/14 0615  HGB  --  11.6* 10.8*  HCT  --  36.7 34.2*  PLT  --  142* 123*  LABPROT  --  14.0 26.3*  INR  --  1.10 2.52*  CREATININE 0.62  --  0.69    Estimated Creatinine Clearance: 95.7 ml/min (by C-G formula based on Cr of 0.69).   Medical History: Past Medical History  Diagnosis Date  . Hypothyroidism   . Sleep apnea   . GERD (gastroesophageal reflux disease)   . Fibromyalgia   . Arthritis   . Encounter for blood transfusion   . Hepatitis     C-   years ago  . Anemia   . DDD (degenerative disc disease)     lumbar, cervical  . Bipolar disorder   . Chronic pain syndrome   . Anxiety   . Depression   . Asthma     intrensic    Medications:  Scheduled:  . buprenorphine  8 mg Sublingual TID  . celecoxib  200 mg Oral Q12H  . docusate sodium  100 mg Oral BID  . ferrous sulfate  325 mg Oral TID PC  . gabapentin  300 mg Oral TID  . hydrOXYzine  50 mg Oral QID  . levothyroxine  137 mcg Oral QAC breakfast  . pantoprazole  80 mg Oral Q1200  . polyethylene glycol  17 g Oral BID  . pregabalin  300 mg Oral BID  . risperiDONE  4 mg Oral QHS  . traZODone  100 mg Oral QHS  . Warfarin - Pharmacist Dosing Inpatient   Does not apply q1800   Infusions:  . sodium chloride 0.9 % 1,000 mL with potassium chloride 10 mEq infusion 100 mL/hr (04/11/14 2245)   PRN: alum & mag hydroxide-simeth, bisacodyl, clonazePAM, diphenhydrAMINE, HYDROmorphone (DILAUDID) injection, menthol-cetylpyridinium,  methocarbamol (ROBAXIN) IV, methocarbamol, metoCLOPramide (REGLAN) injection, metoCLOPramide, naLOXone (NARCAN)  injection, ondansetron (ZOFRAN) IV, ondansetron, oxyCODONE, phenol  Assessment:  53 yo caucasion F s/p Total Left Hip Revision on 6/15.  Patient to start warfarin for VTE prophylaxis.  Patient was not on any anticoagulations prior to admission per medication history.   Patient has been overly sedated and not eating, team holding pain meds til she is much more awake  INR had sharp increase from 1.10 to 2.52  Goal of Therapy:  INR 2-3 Monitor platelets by anticoagulation protocol: Yes   Plan:  1.)  Hold warfarin tonight 2.) Daily PT/INR 3.) Warfarin education to be provided by pharmacist    Arley PhenixEllen Jackson RPh 04/12/2014, 3:25 PM Pager [phone]

## 2014-04-12 NOTE — Progress Notes (Signed)
   Subjective: 2 Days Post-Op Procedure(s) (LRB): TOTAL LEFT  HIP REVISION (Left)   Patient reports pain as mild, pain controlled. She is still slow to respond, but much better than she was yesterday. She was given Narcan yesterday which eventually helped some. All pain medications are being held at this time, until she is much more awake.  Objective:   VITALS:   Filed Vitals:   04/12/14 0608  BP: 126/82  Pulse: 51  Temp: 98.5 F (36.9 C)  Resp: 12    Dorsiflexion/Plantar flexion intact Incision: dressing C/D/I No cellulitis present Compartment soft  LABS  Recent Labs  04/11/14 0620 04/12/14 0615  HGB 11.6* 10.8*  HCT 36.7 34.2*  WBC 11.5* 9.5  PLT 142* 123*     Recent Labs  04/11/14 0443 04/12/14 0615  NA 140 140  K 5.0 4.6  BUN 10 11  CREATININE 0.62 0.69  GLUCOSE 146* 103*     Assessment/Plan: 2 Days Post-Op Procedure(s) (LRB): TOTAL LEFT  HIP REVISION (Left) Up with therapy Discharge home with home health / SNF eventually, when ready  Expected ABLA  Treated with iron and will observe  Obese (BMI 30-39.9) Estimated body mass index is 37.9 kg/(m^2) as calculated from the following:   Height as of this encounter: 5\' 4"  (1.626 m).   Weight as of this encounter: 100.2 kg (220 lb 14.4 oz). Patient also counseled that weight may inhibit the healing process Patient counseled that losing weight will help with future health issues       Anastasio AuerbachMatthew S. Amayiah Gosnell   PAC  04/12/2014, 9:26 AM

## 2014-04-12 NOTE — Progress Notes (Signed)
pt is sedated. Arouses to touch and voice. One-word answers to questions then pt goes back to sleep. Heart rate in 40s; respiratory rate 11. BP stable. O2 sat on 2L upper 90s. Narcan 0.2 given per order. No change in mental state. Notified Rapid response nurse, she suggested medical consult and liver studies. Pt is in sinus bradycardia. Called PA Marriottmber Constable. Full metabolic panel and liver studies ordered, okay to leave foley in until evaluated in AM. She [facility] know in AM.

## 2014-04-12 NOTE — Progress Notes (Signed)
CSW met with pt this am to assist with d/c planning. PN reviewed. PT / OT are recommending [facility]. Pt became tearful when CSW tried to discuss this with her.  Pt would only state, " I want to go home.  My husband will take care of me. "  CSW is available to assist with rehab placement if pt will agree with plan.   Werner Lean LCSW [phone]

## 2014-04-12 NOTE — Progress Notes (Signed)
Physical Therapy Treatment Patient Details Name: Tina Weeks MRN: [id number] DOB: [birth year] Today's Date: 04/12/2014    History of Present Illness Pt is a 53 year old female s/p L total hip revision    PT Comments    Pt more awake/alert today and able to ambulate in hallway and perform steps however increased cues provided for technique and safety.  Spouse present and observed session, also educated on posterior hip precautions and PWB.  Spouse feels he can manage pt at home and pt reports desire to d/c home today.  Would recommend further PT in acute care prior to d/c due to cognition and safety concerns (frequent safety cues required).   Follow Up Recommendations  Home health PT     Equipment Recommendations  None recommended by PT    Recommendations for Other Services       Precautions / Restrictions Precautions Precautions: Posterior Hip Precaution Comments: reviewed hip precautions and PWB with pt and spouse, provided handout Restrictions Weight Bearing Restrictions: Yes LLE Weight Bearing: Partial weight bearing    Mobility  Bed Mobility Overal bed mobility: Needs Assistance Bed Mobility: Supine to Sit     Supine to sit: Supervision     General bed mobility comments: pt able to perform without assist however required cues for hip precautions  Transfers Overall transfer level: Needs assistance Equipment used: Rolling walker (2 wheeled) Transfers: Sit to/from Stand Sit to Stand: Min guard         General transfer comment: verbal cues for safe technique and hip precautions  Ambulation/Gait Ambulation/Gait assistance: Min assist Ambulation Distance (Feet): 50 Feet Assistive device: Rolling walker (2 wheeled) Gait Pattern/deviations: Step-to pattern;Antalgic;Trunk flexed     General Gait Details: max verbal and visual cues for sequence, technique, RW distance, safety   Stairs Stairs: Yes Stairs assistance: Min guard Stair Management: Step to  pattern;Forwards;Two rails Number of Stairs: 2 General stair comments: verbal cues for sequence and safety, increased verbal cues for correct sequence, spouse present and recalls technique from pt's previous surgery  Wheelchair Mobility    Modified Rankin (Stroke Patients Only)       Balance                                    Cognition Arousal/Alertness: Awake/alert Behavior During Therapy: Restless   Area of Impairment: Memory;Following commands;Safety/judgement;Problem solving     Memory: Decreased recall of precautions;Decreased short-term memory Following Commands: Follows one step commands with increased time Safety/Judgement: Decreased awareness of safety;Decreased awareness of deficits   Problem Solving: Slow processing;Decreased initiation;Difficulty sequencing;Requires verbal cues General Comments: more talkative and alert today however appears to continue to have decreased cognition requiring frequent cues for technique and safety, often would repeat back what therapist would say    Exercises Total Joint Exercises Ankle Circles/Pumps: AROM;Both;15 reps Quad Sets: AROM;Both;15 reps Gluteal Sets: AROM;Both;10 reps Towel Squeeze: AROM;Both;10 reps Short Arc QuadBarbaraann Boys: AAROM;Left;10 reps Heel Slides: AAROM;Left;10 reps;Other (comment) (within precautions) Hip ABduction/ADduction: AAROM;Left;10 reps    General Comments        Pertinent Vitals/Pain Pt reports no pain however stated mild pain with exercises so modified to decrease pain, ice pack applied end of session NT in to check vitals and reports decrease of sats throughout the day so maintained 3L O2 [place] throughout session    Home Living Family/patient expects to be discharged to:: Private residence Living Arrangements: Spouse/significant other   Type of  Home: House Home Access: Stairs to enter Entrance Stairs-Rails: Left;Right;Can reach both Home Layout: One level Home Equipment: Bedside  commode;Walker - 2 wheels;Cane - single point Additional Comments: per spouse    Prior Function Level of Independence: Independent          PT Goals (current goals can now be found in the care plan section) Acute Rehab PT Goals Patient Stated Goal: " I want to go home" Progress towards PT goals: Progressing toward goals    Frequency  7X/week    PT Plan Discharge plan needs to be updated    Co-evaluation             End of Session Equipment Utilized During Treatment: Gait belt Activity Tolerance: Other (comment) (decreased cognition) Patient left: in chair;with call bell/phone within reach;with family/visitor present     Time: 1343-1417 PT Time Calculation (min): 34 min  Charges:  $Gait Training: 8-22 mins $Therapeutic Exercise: 8-22 mins                    G Codes:      Weeks,KATHrine E 04/12/2014, 2:47 PM Tina Weeks, PT, DPT 04/12/2014 Pager: [phone]

## 2014-04-13 LAB — PROTIME-INR
INR: 4.5 — ABNORMAL HIGH (ref 0.00–1.49)
Prothrombin Time: 41 seconds — ABNORMAL HIGH (ref 11.6–15.2)

## 2014-04-13 MED ORDER — POLYETHYLENE GLYCOL 3350 17 G PO PACK: 17.0000 g | PACK | Freq: Two times a day (BID) | ORAL | Status: AC

## 2014-04-13 MED ORDER — ASPIRIN EC 325 MG PO TBEC: 325.0000 mg | DELAYED_RELEASE_TABLET | Freq: Two times a day (BID) | ORAL | Status: AC

## 2014-04-13 MED ORDER — METHOCARBAMOL 500 MG PO TABS: 500.0000 mg | ORAL_TABLET | Freq: Four times a day (QID) | ORAL | Status: AC | PRN

## 2014-04-13 MED ORDER — FERROUS SULFATE 325 (65 FE) MG PO TABS: 325.0000 mg | ORAL_TABLET | Freq: Three times a day (TID) | ORAL | Status: AC

## 2014-04-13 MED ORDER — METHOCARBAMOL 500 MG PO TABS: 500.0000 mg | ORAL_TABLET | Freq: Four times a day (QID) | ORAL | Status: DC | PRN

## 2014-04-13 MED ORDER — DSS 100 MG PO CAPS: 100.0000 mg | ORAL_CAPSULE | Freq: Two times a day (BID) | ORAL | Status: AC

## 2014-04-13 NOTE — Progress Notes (Signed)
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 Shellfish Allergy Hives    Patient Measurements: Height: 5\' 4"  (162.6 cm) Weight: 220 lb 14.4 oz (100.2 kg) IBW/kg (Calculated) : 54.7  Vital Signs: Temp: 98.7 F (37.1 C) (06/18 0522) Temp src: Oral (06/18 0522) BP: 121/73 mmHg (06/18 0522) Pulse Rate: 89 (06/18 0522)  Labs:  Recent Labs  04/11/14 0443 04/11/14 0620 04/12/14 0615 04/13/14 0513  HGB  --  11.6* 10.8*  --   HCT  --  36.7 34.2*  --   PLT  --  142* 123*  --   LABPROT  --  14.0 26.3* 41.0*  INR  --  1.10 2.52* 4.50*  CREATININE 0.62  --  0.69  --     Estimated Creatinine Clearance: 95.7 ml/min (by C-G formula based on Cr of 0.69).   Medical History: Past Medical History  Diagnosis Date  . Hypothyroidism   . Sleep apnea   . GERD (gastroesophageal reflux disease)   . Fibromyalgia   . Arthritis   . Encounter for blood transfusion   . Hepatitis     C-   years ago  . Anemia   . DDD (degenerative disc disease)     lumbar, cervical  . Bipolar disorder   . Chronic pain syndrome   . Anxiety   . Depression   . Asthma     intrensic    Medications:  Scheduled:  . buprenorphine  8 mg Sublingual TID  . celecoxib  200 mg Oral Q12H  . docusate sodium  100 mg Oral BID  . ferrous sulfate  325 mg Oral TID PC  . gabapentin  300 mg Oral TID  . hydrOXYzine  50 mg Oral QID  . levothyroxine  137 mcg Oral QAC breakfast  . pantoprazole  80 mg Oral Q1200  . polyethylene glycol  17 g Oral BID  . pregabalin  300 mg Oral BID  . risperiDONE  4 mg Oral QHS  . traZODone  100 mg Oral QHS  . Warfarin - Pharmacist Dosing Inpatient   Does not apply q1800   Infusions:  . sodium chloride 0.9 % 1,000 mL with potassium chloride 10 mEq infusion 100 mL/hr (04/11/14 2245)   PRN: alum & mag hydroxide-simeth, bisacodyl, clonazePAM, diphenhydrAMINE, HYDROmorphone (DILAUDID)  injection, menthol-cetylpyridinium, methocarbamol (ROBAXIN) IV, methocarbamol, metoCLOPramide (REGLAN) injection, metoCLOPramide, naLOXone (NARCAN)  injection, ondansetron (ZOFRAN) IV, ondansetron, oxyCODONE, phenol  Inpatient warfarin doses administered 6/15 - 6/17: 7.5 mg, 7.5 mg, 0 mg.  Assessment: 53 y/o F s/p revision L THA on 6/15, was placed on warfarin postoperatively for VTE prophylaxis with pharmacy dosing assistance requested.   Goal of Therapy:  INR 2-3  6/18: POD#3 Patient was extremely sedated yesterday.  Warfarin was held despite therapeutic INR because of the sharp upward trajectory and concerns over minimal dietary intake while oversedated. Today, INR is supratherapeutic.  No overt bleeding reported in chart notes. Anticipate warfarin requirements will be minimal until oversedation resolves and patient is able to consume full portions of regular diet.    Plan:  1.) Hold warfarin again tonight 2.) Daily PT/INR 3.) Warfarin education to be provided by pharmacist.   Elie Goodyandy Maizey Menendez, PharmD, BCPS Pager: [phone] 04/13/2014  7:15 AM

## 2014-04-13 NOTE — Progress Notes (Signed)
Physical Therapy Treatment Patient Details Name: Tina Weeks CourseClaudia Ojeda MRN: [id number] DOB: [birth year] Today's Date: 04/13/2014    History of Present Illness Pt is a 53 year old female s/p L total hip revision    PT Comments    Pt with improved cognition today.  Pt ambulated in hallway, practiced steps, and performed LE exercises; still requires cues however much improved since yesterday.  Pt and spouse had no questions/concerns and pt will likely d/c home today.   Follow Up Recommendations  Home health PT     Equipment Recommendations  3in1 (PT);Rolling walker with 5" wheels (husband requesting RW and BSC)    Recommendations for Other Services       Precautions / Restrictions Precautions Precautions: Posterior Hip Precaution Comments: reviewed hip precautions and PWB with pt and spouse Restrictions Weight Bearing Restrictions: Yes LLE Weight Bearing: Partial weight bearing    Mobility  Bed Mobility Overal bed mobility: Needs Assistance Bed Mobility: Supine to Sit     Supine to sit: Supervision     General bed mobility comments: pt able to perform without assist however required cues for hip precautions  Transfers Overall transfer level: Needs assistance Equipment used: Rolling walker (2 wheeled) Transfers: Sit to/from Stand Sit to Stand: Min guard         General transfer comment: verbal cues for safe technique and hip precautions  Ambulation/Gait Ambulation/Gait assistance: Min guard Ambulation Distance (Feet): 200 Feet (x2) Assistive device: Rolling walker (2 wheeled) Gait Pattern/deviations: Step-through pattern;Antalgic;Trunk flexed     General Gait Details: verbal cues for RW distance and PWB   Stairs Stairs: Yes Stairs assistance: Min guard Stair Management: Step to pattern;Forwards;Two rails Number of Stairs: 2 General stair comments: verbal cues for sequence and safety   Wheelchair Mobility    Modified Rankin (Stroke Patients Only)        Balance                                    Cognition Arousal/Alertness: Awake/alert Behavior During Therapy: Restless Overall Cognitive Status: Impaired/Different from baseline Area of Impairment: Memory     Memory: Decreased recall of precautions;Decreased short-term memory         General Comments: better safety today then yesterday, less cues required and pt able to follow commands    Exercises Total Joint Exercises Ankle Circles/Pumps: AROM;Both;15 reps Quad Sets: AROM;Both;15 reps Gluteal Sets: AROM;Both;10 reps Towel Squeeze: AROM;Both;15 reps Short Arc QuadBarbaraann Boys: AAROM;Left;15 reps Heel Slides: AAROM;Left;10 reps;Other (comment) (within precautions) Hip ABduction/ADduction: AAROM;Left;10 reps    General Comments        Pertinent Vitals/Pain No pain during mobility, modified exercises within tolerance, repositioned    Home Living                      Prior Function            PT Goals (current goals can now be found in the care plan section) Progress towards PT goals: Progressing toward goals    Frequency  7X/week    PT Plan Current plan remains appropriate    Co-evaluation             End of Session Equipment Utilized During Treatment: Gait belt Activity Tolerance: Patient tolerated treatment well Patient left: in chair;with call bell/phone within reach;with family/visitor present     Time: 2956-21300932-1004 PT Time Calculation (min): 32 min  Charges:  $Gait  Training: 8-22 mins $Therapeutic Exercise: 8-22 mins                    G Codes:      LEMYRE,KATHrine E 04/13/2014, 11:39 AM Zenovia JarredKati Lemyre, PT, DPT 04/13/2014 Pager: [phone]

## 2014-04-13 NOTE — Progress Notes (Signed)
   Subjective: 3 Days Post-Op Procedure(s) (LRB): TOTAL LEFT  HIP REVISION (Left)   Patient reports pain as mild, pain well controlled. No events throughout the night. Working well with PT. Ready to be discharged home.  Objective:   VITALS:   Filed Vitals:   04/13/14 0522  BP: 121/73  Pulse: 89  Temp: 98.7 F (37.1 C)  Resp: 16    Neurovascular intact Dorsiflexion/Plantar flexion intact Incision: dressing C/D/I No cellulitis present Compartment soft  LABS  Recent Labs  04/11/14 0620 04/12/14 0615  HGB 11.6* 10.8*  HCT 36.7 34.2*  WBC 11.5* 9.5  PLT 142* 123*     Recent Labs  04/11/14 0443 04/12/14 0615  NA 140 140  K 5.0 4.6  BUN 10 11  CREATININE 0.62 0.69  GLUCOSE 146* 103*     Assessment/Plan: 3 Days Post-Op Procedure(s) (LRB): TOTAL LEFT  HIP REVISION (Left) Up with therapy Discharge home with home health [facility]. Follow up with OLIN,MATTHEW D in 2 weeks.  Contact information:  [facility] [address], [address] [place] [id number] [phone]        Anastasio AuerbachMatthew S. Babish   PAC  04/13/2014, 12:57 PM

## 2014-04-13 NOTE — Progress Notes (Signed)
[facility] ° °[facility] is providing the following services: RW and [employer] ° °If patient discharges after hours, please call [phone].  ° °Tina Weeks °04/13/2014, 10:43 AM ° ° °

## 2014-04-14 NOTE — Discharge Summary (Signed)
Physician Discharge Summary  Patient ID: Tina Weeks MRN: [id number] DOB/AGE: 52-Dec-1962 52 y.o.  Admit date: 04/10/2014 Discharge date: 04/13/2014   Procedures:  Procedure(s) (LRB): TOTAL LEFT  HIP REVISION (Left)  Attending Physician:  Dr. Durene RomansMatthew Olin   Admission Diagnoses:   Failed left hip arthroplasty  Discharge Diagnoses:  Principal Problem:   S/P left TH revision Active Problems:   Obese   Expected blood loss anemia  Past Medical History  Diagnosis Date  . Hypothyroidism   . Sleep apnea   . GERD (gastroesophageal reflux disease)   . Fibromyalgia   . Arthritis   . Encounter for blood transfusion   . Hepatitis     C-   years ago  . Anemia   . DDD (degenerative disc disease)     lumbar, cervical  . Bipolar disorder   . Chronic pain syndrome   . Anxiety   . Depression   . Asthma     intrensic    HPI: Tina Weeks, 53 y.o. 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 The indications for the revision total hip arthroplasty are pain and malrotation. Onset of symptoms was gradual starting years ago with gradually worsening course since that time. Prior procedures on the left hip include arthroplasty and a revision. Patient currently rates pain in the left hip at 9 out of 10 with activity. There is night pain, worsening of pain with activity and weight bearing, trendelenberg gait, pain that interfers with activities of daily living and pain with passive range of motion. 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 This condition presents safety issues increasing the risk of falls. There is no current active infection. Risks, benefits and expectations were discussed with the patient. Risks including but not limited to the risk of anesthesia, blood clots, nerve damage, blood vessel damage, failure of the prosthesis, infection and up to and including  death. Patient understand the risks, benefits and expectations and wishes to proceed with surgery.  PCP: Dema SeverinYORK,REGINA F, NP   Discharged Condition: good  Hospital Course:  Patient underwent the above stated procedure on 04/10/2014. Patient tolerated the procedure well and brought to the recovery room in good condition and subsequently to the floor.  POD #1 BP: 89/60 ; Pulse: 54 ; Temp: 97.5 F (36.4 C) ; Resp: 17  Patient reports pain as mild. Patient is very slow to do the drugs that she is on. Difficult to keep awake. Incision: dressing C/D/I, no cellulitis present and compartment soft.   LABS  Basename    HGB  11.6  HCT  36.7   POD #2  BP: 126/82 ; Pulse: 51 ; Temp: 98.5 F (36.9 C) ; Resp: 12  Patient reports pain as mild, pain controlled. She is still slow to respond, but much better than she was yesterday. She was given Narcan yesterday which eventually helped some. All pain medications are being held at this time, until she is much more awake. Incision: dressing C/D/I, no cellulitis present and compartment soft.   LABS  Basename    HGB  10.8  HCT  34.2   POD #3  BP: 121/73 ; Pulse: 89 ; Temp: 98.7 F (37.1 C) ; Resp: 16  Patient reports pain as mild, pain well controlled. No events throughout the night. Working well with PT. Ready to be discharged home. Neurovascular intact, dorsiflexion/plantar flexion intact, incision: dressing C/D/I,  no cellulitis present and compartment soft.   LABS   No new labs   Discharge Exam: General appearance: alert, cooperative and no distress Extremities: Homans sign is negative, no sign of DVT, no edema, redness or tenderness in the calves or thighs and no ulcers, gangrene or trophic changes  Disposition: Home with follow up in 2 weeks   Follow-up Information   Follow up with [facility].   Contact information:   [address] [place] [address] [phone]       Follow up with Shelda Pal, MD.  Schedule an appointment as soon as possible for a visit in 2 weeks.   Specialty:  Orthopedic Surgery   Contact information:   [address] [place] [address] [phone]       Discharge Instructions   Call MD / Call 911    Complete by:  As directed   If you experience chest pain or shortness of breath, CALL 911 and be transported to the hospital emergency room.  If you develope a fever above 101 F, pus (white drainage) or increased drainage or redness at the wound, or calf pain, call your surgeon's office.     Change dressing    Complete by:  As directed   Maintain surgical dressing for 10-14 days, or until follow up in the clinic.     Constipation Prevention    Complete by:  As directed   Drink plenty of fluids.  Prune juice may be helpful.  You may use a stool softener, such as Colace (over the counter) 100 mg twice a day.  Use MiraLax (over the counter) for constipation as needed.     Diet - low sodium heart healthy    Complete by:  As directed      Discharge instructions    Complete by:  As directed   Maintain surgical dressing for 10-14 days, or until follow up in the clinic. Follow up in 2 weeks at [facility]. Call with any questions or concerns.     Partial weight bearing    Complete by:  As directed   % Body Weight:  50  Laterality:  left  Extremity:  Lower     TED hose    Complete by:  As directed   Use stockings (TED hose) for 2 weeks on both leg(s).  You may remove them at night for sleeping.             Medication List    STOP taking these medications       aspirin 325 MG tablet  Replaced by:  aspirin EC 325 MG tablet      TAKE these medications       aspirin EC 325 MG tablet  Take 1 tablet (325 mg total) by mouth 2 (two) times daily. Take for 4 weeks.     buprenorphine-naloxone 8-2 MG Subl SL tablet  Commonly known as:  SUBOXONE  Place 1 tablet under the tongue every 8 (eight) hours.     clonazePAM 1 MG tablet    Commonly known as:  KLONOPIN  Take 1 mg by mouth 4 (four) times daily.     DSS 100 MG Caps  Take 100 mg by mouth 2 (two) times daily.     esomeprazole 40 MG capsule  Commonly known as:  NEXIUM  Take 40 mg by mouth daily at 12 noon.     ferrous sulfate 325 (65 FE) MG tablet  Take 1  tablet (325 mg total) by mouth 3 (three) times daily after meals.     gabapentin 300 MG capsule  Commonly known as:  NEURONTIN  Take 300 mg by mouth 3 (three) times daily.     hydrOXYzine 50 MG tablet  Commonly known as:  ATARAX/VISTARIL  Take 50 mg by mouth 4 (four) times daily.     levothyroxine 137 MCG tablet  Commonly known as:  SYNTHROID, LEVOTHROID  Take 137 mcg by mouth daily before breakfast.     magnesium oxide 400 MG tablet  Commonly known as:  MAG-OX  Take 400 mg by mouth 2 (two) times daily.     methocarbamol 500 MG tablet  Commonly known as:  ROBAXIN  Take 1 tablet (500 mg total) by mouth every 6 (six) hours as needed for muscle spasms.     NUCYNTA 100 MG Tabs  Generic drug:  Tapentadol HCl  Take 100 mg by mouth 6 (six) times daily.     polyethylene glycol packet  Commonly known as:  MIRALAX / GLYCOLAX  Take 17 g by mouth 2 (two) times daily.     pregabalin 300 MG capsule  Commonly known as:  LYRICA  Take 300 mg by mouth 2 (two) times daily.     risperiDONE 2 MG tablet  Commonly known as:  RISPERDAL  Take 4 mg by mouth at bedtime.     traZODone 100 MG tablet  Commonly known as:  DESYREL  Take 100 mg by mouth at bedtime.         Signed: Anastasio AuerbachMatthew S. Babish   PA-C  04/14/2014, 3:50 PM

## 2014-04-14 NOTE — Progress Notes (Addendum)
   Subjective: 1 Day Post-Op Procedure(s) (LRB): TOTAL LEFT  HIP REVISION (Left)   Patient reports pain as mild. Patient is very slow to do the drugs that she is on. Difficult to to keep awake.  Objective:   VITALS:   Filed Vitals:   04/11/14  BP: 89/60  Pulse: 54  Temp: 97.5 F (36.4 C)  Resp: 17    Incision: dressing C/D/I No cellulitis present Compartment soft  LABS  Recent Labs  04/11/14 0615  HGB 11.6*  HCT 36.7*     Assessment/Plan: 1 Day Post-Op Procedure(s) (LRB): TOTAL LEFT  HIP REVISION (Left) Foley cath d/c'ed when more awake Hold pain medications until she is awake and can ask for it. Change of medication to formulary medication without Narcan appears to have given her too much medication. Advance diet Up with therapy D/C IV fluids Discharge home with home health eventually, when ready   Anastasio AuerbachMatthew S. Babish   PAC  04/14/2014, 3:53 PM

## 2015-08-22 IMAGING — CR DG HIP 1V PORT*L*
1 series · 1 of 1 positions shown · non-contrast
Comparison: 11/17/2013.  04/10/2014

CLINICAL DATA: The left hip revision surgery.

EXAM:
PORTABLE LEFT HIP - 1 VIEW

[AP]
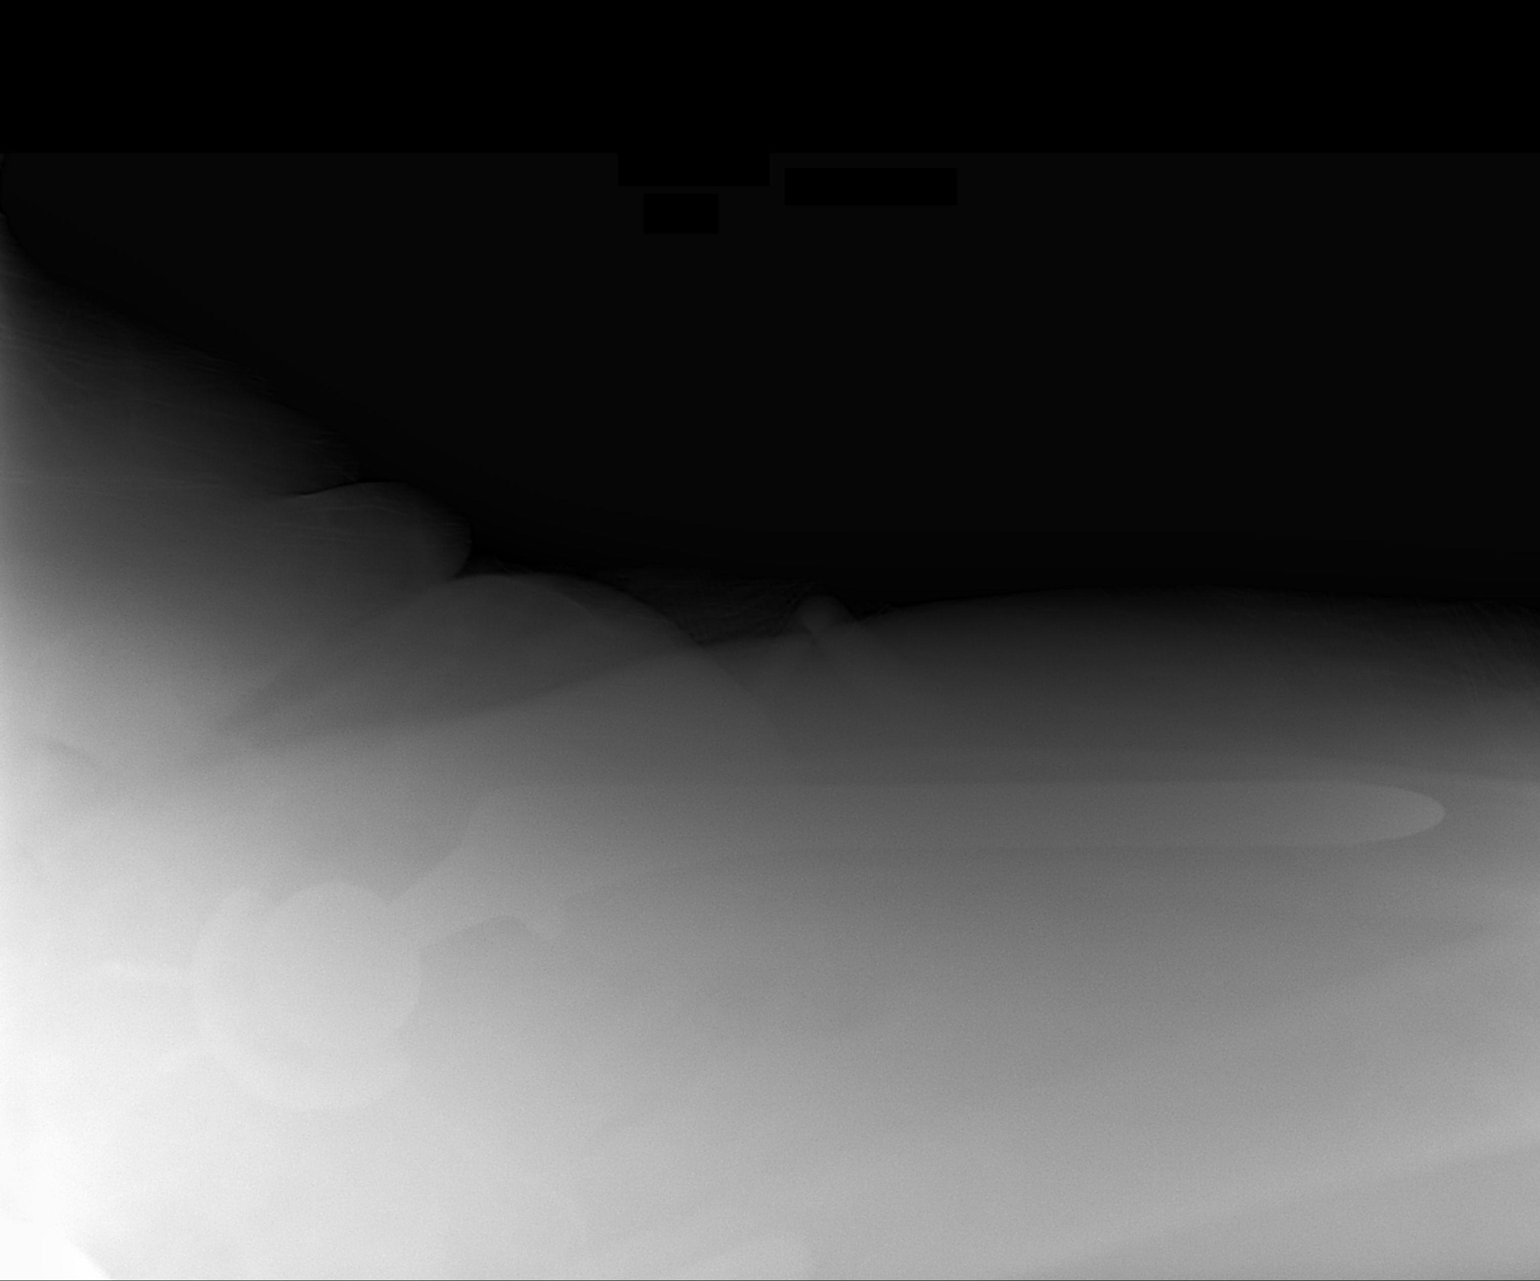

[1 of 1 positions shown; findings below may reference images not displayed]

FINDINGS: Cross-table lateral view demonstrates normal alignment of the left
hip replacement. No visible hardware or bony complicating feature.
IMPRESSION: Left hip revision.  No visible complicating feature.

## 2020-01-26 DEATH — deceased
